# Patient Record
Sex: Female | Born: 1960 | Race: White | Hispanic: No | Smoking: Former smoker
Health system: Southern US, Community
[De-identification: ages and names within clinical notes are randomized; demographics above are authoritative.]

## PROBLEM LIST (undated history)

## (undated) DIAGNOSIS — C801 Malignant (primary) neoplasm, unspecified: Secondary | ICD-10-CM

## (undated) DIAGNOSIS — M1990 Unspecified osteoarthritis, unspecified site: Secondary | ICD-10-CM

## (undated) HISTORY — PX: COLONOSCOPY: SHX174

## (undated) HISTORY — PX: BREAST LUMPECTOMY: SHX2

## (undated) HISTORY — DX: Unspecified osteoarthritis, unspecified site: M19.90

---

## 1982-09-24 HISTORY — PX: TUBAL LIGATION: SHX77

## 1998-01-31 ENCOUNTER — Other Ambulatory Visit: Admission: RE | Admit: 1998-01-31 | Discharge: 1998-01-31 | Payer: Self-pay | Admitting: Gynecology

## 2007-10-03 ENCOUNTER — Encounter: Admission: RE | Admit: 2007-10-03 | Discharge: 2007-10-03 | Payer: Self-pay | Admitting: Family Medicine

## 2008-10-25 ENCOUNTER — Other Ambulatory Visit: Admission: RE | Admit: 2008-10-25 | Discharge: 2008-10-25 | Payer: Self-pay | Admitting: Obstetrics and Gynecology

## 2011-08-08 ENCOUNTER — Ambulatory Visit (HOSPITAL_COMMUNITY)
Admission: RE | Admit: 2011-08-08 | Discharge: 2011-08-08 | Disposition: A | Discharge status: Home / Self Care | Payer: BC Managed Care – PPO | Source: Ambulatory Visit | Attending: Chiropractic Medicine | Admitting: Chiropractic Medicine

## 2011-08-08 ENCOUNTER — Other Ambulatory Visit (HOSPITAL_COMMUNITY): Payer: Self-pay | Admitting: Chiropractic Medicine

## 2011-08-08 DIAGNOSIS — M542 Cervicalgia: Secondary | ICD-10-CM | POA: Insufficient documentation

## 2011-09-25 HISTORY — PX: COLOSTOMY: SHX63

## 2012-01-10 ENCOUNTER — Encounter: Payer: Self-pay | Admitting: Internal Medicine

## 2012-01-10 ENCOUNTER — Other Ambulatory Visit: Payer: Self-pay | Admitting: Specialist

## 2012-01-10 DIAGNOSIS — Z1231 Encounter for screening mammogram for malignant neoplasm of breast: Secondary | ICD-10-CM

## 2012-01-16 ENCOUNTER — Encounter: Payer: Self-pay | Admitting: Internal Medicine

## 2012-01-16 ENCOUNTER — Ambulatory Visit (AMBULATORY_SURGERY_CENTER): Payer: BC Managed Care – PPO | Admitting: *Deleted

## 2012-01-16 VITALS — Ht 67.0 in | Wt 220.9 lb

## 2012-01-16 DIAGNOSIS — Z1211 Encounter for screening for malignant neoplasm of colon: Secondary | ICD-10-CM

## 2012-01-16 MED ORDER — MOVIPREP 100 G PO SOLR: ORAL | Status: DC

## 2012-01-28 ENCOUNTER — Encounter: Payer: Self-pay | Admitting: Internal Medicine

## 2012-01-28 ENCOUNTER — Ambulatory Visit (AMBULATORY_SURGERY_CENTER): Payer: BC Managed Care – PPO | Admitting: Internal Medicine

## 2012-01-28 VITALS — BP 132/81 | HR 69 | Temp 97.5°F | Resp 18 | Ht 67.0 in | Wt 220.0 lb

## 2012-01-28 DIAGNOSIS — Z1211 Encounter for screening for malignant neoplasm of colon: Secondary | ICD-10-CM

## 2012-01-28 DIAGNOSIS — K5730 Diverticulosis of large intestine without perforation or abscess without bleeding: Secondary | ICD-10-CM

## 2012-01-28 MED ORDER — SODIUM CHLORIDE 0.9 % IV SOLN: 500.0000 mL | INTRAVENOUS | Status: DC

## 2012-01-28 NOTE — Patient Instructions (Signed)
A handout was given to your care partner on diverticulosis and a high fiber diet.  You may resume your prior medications today.  Please call if any questions or concerns.    YOU HAD AN ENDOSCOPIC PROCEDURE TODAY AT THE [facility]: Refer to the procedure report that was given to you for any specific questions about what was found during the examination.  If the procedure report does not answer your questions, please call your gastroenterologist to clarify.  If you requested that your care partner not be given the details of your procedure findings, then the procedure report has been included in a sealed envelope for you to review at your convenience later.  YOU SHOULD EXPECT: Some feelings of bloating in the abdomen. Passage of more gas than usual.  Walking can help get rid of the air that was put into your GI tract during the procedure and reduce the bloating. If you had a lower endoscopy (such as a colonoscopy or flexible sigmoidoscopy) you may notice spotting of blood in your stool or on the toilet paper. If you underwent a bowel prep for your procedure, then you may not have a normal bowel movement for a few days.  DIET: Your first meal following the procedure should be a light meal and then it is ok to progress to your normal diet.  A half-sandwich or bowl of soup is an example of a good first meal.  Heavy or fried foods are harder to digest and may make you feel nauseous or bloated.  Likewise meals heavy in dairy and vegetables can cause extra gas to form and this can also increase the bloating.  Drink plenty of fluids but you should avoid alcoholic beverages for 24 hours.  ACTIVITY: Your care partner should take you home directly after the procedure.  You should plan to take it easy, moving slowly for the rest of the day.  You can resume normal activity the day after the procedure however you should NOT DRIVE or use heavy machinery for 24 hours (because of the sedation medicines used  during the test).    SYMPTOMS TO REPORT IMMEDIATELY: A gastroenterologist can be reached at any hour.  During normal business hours, 8:30 AM to 5:00 PM Monday through Friday, call [phone].  After hours and on weekends, please call the GI answering service at [phone] who will take a message and have the physician on call contact you.   Following lower endoscopy (colonoscopy or flexible sigmoidoscopy):  Excessive amounts of blood in the stool  Significant tenderness or worsening of abdominal pains  Swelling of the abdomen that is new, acute  Fever of 100F or higher    FOLLOW UP: If any biopsies were taken you will be contacted by phone or by letter within the next 1-3 weeks.  Call your gastroenterologist if you have not heard about the biopsies in 3 weeks.  Our staff will call the home number listed on your records the next business day following your procedure to check on you and address any questions or concerns that you may have at that time regarding the information given to you following your procedure. This is a courtesy call and so if there is no answer at the home number and we have not heard from you through the emergency physician on call, we will assume that you have returned to your regular daily activities without incident.  SIGNATURES/CONFIDENTIALITY: You and/or your care partner have signed paperwork which will be entered  into your electronic medical record.  These signatures attest to the fact that that the information above on your After Visit Summary has been reviewed and is understood.  Full responsibility of the confidentiality of this discharge information lies with you and/or your care-partner.

## 2012-01-28 NOTE — Op Note (Signed)
[facility] [address] [place], [place]  [address]  COLONOSCOPY PROCEDURE REPORT  PATIENT:  Jaime, Grimes  MR#:  [id number] BIRTHDATE:  10/05/1960, 51 yrs. old  GENDER:  female ENDOSCOPIST:  Iva Boop, MD, [facility] REF. BY:          Arther Abbott, MD PROCEDURE DATE:  01/28/2012 PROCEDURE:  Colonoscopy 19147 ASA CLASS:  Class I INDICATIONS:  Routine Risk Screening MEDICATIONS:   These medications were titrated to patient response per physician's verbal order, Fentanyl 50 mcg IV, Versed 6 mg IV  DESCRIPTION OF PROCEDURE:   After the risks benefits and alternatives of the procedure were thoroughly explained, informed consent was obtained.  Digital rectal exam was performed and revealed no abnormalities.   The LB [id number]-[id number] endoscope was introduced through the anus and advanced to the cecum, which was identified by both the appendix and ileocecal valve, without limitations.  The quality of the prep was excellent, using MoviPrep.  The instrument was then slowly withdrawn as the colon was fully examined. <<PROCEDUREIMAGES>>  FINDINGS:  Moderate diverticulosis was found in the sigmoid colon. This was otherwise a normal examination of the colon. Includes right colon retroflexion.   Retroflexed views in the rectum revealed no abnormalities.    The time to cecum = 2:24 minutes. The scope was then withdrawn in 9:04 minutes from the cecum and the procedure completed. COMPLICATIONS:  None ENDOSCOPIC IMPRESSION: 1) Moderate diverticulosis in the sigmoid colon 2) Otherwise normal examination  REPEAT EXAM:  In 10 year(s) for routine screening colonoscopy.  Iva Boop, MD, [facility]  CC:  The Patient and Arther Abbott, MD  n. Rosalie Doctor:   Iva Boop at 01/28/2012 11:21 AM  Ebony Hail, [phone]

## 2012-01-29 ENCOUNTER — Telehealth: Payer: Self-pay

## 2012-01-29 NOTE — Telephone Encounter (Signed)
  Follow up Call-  Call back number 01/28/2012  Post procedure Call Back phone  # [phone]  Permission to leave phone message Yes     Patient questions:  Do you have a fever, pain , or abdominal swelling? no Pain Score  0 *  Have you tolerated food without any problems? yes  Have you been able to return to your normal activities? yes  Do you have any questions about your discharge instructions: Diet   no Medications  no Follow up visit  no  Do you have questions or concerns about your Care? no  Actions: * If pain score is 4 or above: No action needed, pain <4.

## 2012-02-06 ENCOUNTER — Ambulatory Visit
Admission: RE | Admit: 2012-02-06 | Discharge: 2012-02-06 | Disposition: A | Discharge status: Home / Self Care | Payer: BC Managed Care – PPO | Source: Ambulatory Visit | Attending: Specialist | Admitting: Specialist

## 2012-02-06 DIAGNOSIS — Z1231 Encounter for screening mammogram for malignant neoplasm of breast: Secondary | ICD-10-CM

## 2012-02-08 ENCOUNTER — Other Ambulatory Visit: Payer: Self-pay | Admitting: Specialist

## 2012-02-08 DIAGNOSIS — R928 Other abnormal and inconclusive findings on diagnostic imaging of breast: Secondary | ICD-10-CM

## 2012-02-11 ENCOUNTER — Other Ambulatory Visit: Payer: Self-pay | Admitting: Specialist

## 2012-02-11 DIAGNOSIS — R928 Other abnormal and inconclusive findings on diagnostic imaging of breast: Secondary | ICD-10-CM

## 2014-01-06 DIAGNOSIS — C50911 Malignant neoplasm of unspecified site of right female breast: Secondary | ICD-10-CM | POA: Insufficient documentation

## 2014-02-10 ENCOUNTER — Emergency Department (HOSPITAL_COMMUNITY)
Admission: EM | Admit: 2014-02-10 | Discharge: 2014-02-10 | Disposition: A | Discharge status: Home / Self Care | Payer: BC Managed Care – PPO | Source: Home / Self Care | Attending: Family Medicine | Admitting: Family Medicine

## 2014-02-10 ENCOUNTER — Encounter (HOSPITAL_COMMUNITY): Payer: Self-pay | Admitting: Emergency Medicine

## 2014-02-10 DIAGNOSIS — H15009 Unspecified scleritis, unspecified eye: Secondary | ICD-10-CM

## 2014-02-10 DIAGNOSIS — H15102 Unspecified episcleritis, left eye: Secondary | ICD-10-CM

## 2014-02-10 HISTORY — DX: Malignant (primary) neoplasm, unspecified: C80.1

## 2014-02-10 NOTE — ED Provider Notes (Signed)
Jaime Grimes is a 53 y.o. female who presents to Urgent Care today for left eye irritation present for the last 2-3 days. Patient denies any pain or blurry vision. She notes a small amount of discharge. No fevers or chills nausea vomiting or diarrhea. She feels well otherwise. No foreign body. No contact lenses. No treatments tried.   Past Medical History  Diagnosis Date  . Cancer    History  Substance Use Topics  . Smoking status: Current Every Day Smoker -- 0.50 packs/day    Types: Cigarettes  . Smokeless tobacco: Not on file  . Alcohol Use: No   ROS as above Medications: No current facility-administered medications for this encounter.   Current Outpatient Prescriptions  Medication Sig Dispense Refill  . tamoxifen (NOLVADEX) 20 MG tablet Take 20 mg by mouth daily.      . Multiple Vitamins-Minerals (MULTIVITAMIN WITH MINERALS) tablet Take 1 tablet by mouth daily.        Exam:  BP 123/83  Pulse 72  Temp(Src) 98.4 F (36.9 C) (Oral)  Resp 16  SpO2 97% Gen: Well NAD HEENT: EOMI,  MMM injection of the conjunctiva of the sclera on the medial border sparing the cornea. No significant conjunctiva injection along the eyelid. No further injection on the lateral aspect of the eye. No nodular areas noted. PERRLA bilaterally Lungs: Normal work of breathing. CTABL Heart: RRR no MRG Abd: NABS, Soft. NT, ND Exts: Brisk capillary refill, warm and well perfused.   No results found for this or any previous visit (from the past 24 hour(s)). No results found.  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 Plan to treat with lubricating eyedrops. Followup with ophthalmology if not improved.  Discussed warning signs or symptoms. Please see discharge instructions. Patient expresses understanding.    Gregor Hams, MD 02/10/14 [phone]

## 2014-02-10 NOTE — ED Notes (Signed)
Reports redness and drainage from left eye since 5/18.   Denies fever, n/v/d    No otc meds tried.

## 2014-02-10 NOTE — Discharge Instructions (Signed)
Thank you for coming in today. Use Systane artificial tears.  Followup with Dr. Ellie Lunch if not getting better.    Scleritis and Episcleritis The outer part of the eyeball is covered with a tough fibrous covering called the sclera. It is the white part of the eye. This tough covering also has a thin membrane lying on top of it called the episclera.   When the sclera becomes red and sore (inflamed), it is called scleritis.  When the episclera becomes inflamed, it is called episcleritis. CAUSES   Scleritis is usually more severe and is associated with autoimmune diseases such as:  Rheumatoid arthritis.  Inflammations of the bowel such as Crohn's Disease (regional enteritis).  Ulcerative colitis.  Episcleritis usually has no known cause. SYMPTOMS  Both scleritis and episcleritis cause red patches or a nodule on the eye. DIAGNOSIS  This condition should be examined by an ophthalmologist. This is because very strong medications that have side effects to the body and eye may be required to treat severe attacks. Further investigations into the patient's general health may be necessary. TREATMENT   Episcleritis tends to get better without treatment within a week or two.  Scleritis is more severe. Often, your caregiver will prescribe steroids by mouth (orally) or as drops in the eye. This treatment helps lessen the redness and soreness (inflammation). HOME CARE INSTRUCTIONS   Take all medications as directed.  Keep your follow-up appointments as directed.  Avoid irritation of the involved eye(s).  Stop using hard or soft contact lenses until your caregiver tells you that it is safe to use them. SEEK MEDICAL CARE IF:   Redness or irritation gets worse.  You develop pain or sensitivity to light.  You develop any change in vision in the involved eye(s). Document Released: 09/04/2001 Document Revised: 12/03/2011 Document Reviewed: 01/06/2009 [facility] Patient Information 2014  [place], [place].

## 2014-03-03 DIAGNOSIS — E669 Obesity, unspecified: Secondary | ICD-10-CM | POA: Insufficient documentation

## 2014-03-04 HISTORY — PX: BREAST LUMPECTOMY: SHX2

## 2014-12-21 DIAGNOSIS — Z5181 Encounter for therapeutic drug level monitoring: Secondary | ICD-10-CM | POA: Insufficient documentation

## 2015-08-31 ENCOUNTER — Encounter: Payer: Self-pay | Admitting: Obstetrics and Gynecology

## 2015-08-31 ENCOUNTER — Ambulatory Visit (INDEPENDENT_AMBULATORY_CARE_PROVIDER_SITE_OTHER): Payer: BLUE CROSS/BLUE SHIELD | Admitting: Obstetrics and Gynecology

## 2015-08-31 VITALS — BP 138/88 | HR 80 | Resp 17 | Ht 66.0 in | Wt 218.0 lb

## 2015-08-31 DIAGNOSIS — Z01419 Encounter for gynecological examination (general) (routine) without abnormal findings: Secondary | ICD-10-CM | POA: Diagnosis not present

## 2015-08-31 DIAGNOSIS — Z23 Encounter for immunization: Secondary | ICD-10-CM | POA: Diagnosis not present

## 2015-08-31 DIAGNOSIS — Z853 Personal history of malignant neoplasm of breast: Secondary | ICD-10-CM | POA: Diagnosis not present

## 2015-08-31 DIAGNOSIS — Z124 Encounter for screening for malignant neoplasm of cervix: Secondary | ICD-10-CM | POA: Diagnosis not present

## 2015-08-31 DIAGNOSIS — Z13820 Encounter for screening for osteoporosis: Secondary | ICD-10-CM

## 2015-08-31 NOTE — Progress Notes (Signed)
Patient ID: Jaime Grimes, female   DOB: 1960-10-10, 54 y.o.   MRN: [id number] 54 y.o. G1P1001 Widowed CaucasianF here for annual exam.  She is PMP, no bleeding. No vasomotor symptoms. She has a h/o right breast cancer (2012), s/p lumpectomy, radiation. No chemotherapy. She is on an aromatase inhibitor. She just saw her oncologist and had a mammogram. She is due for a DEXA.  Not currently sexually active. Husband died 4 years ago, CHF.  She has lost 22 lbs since September. Working out a lot, has a Physiological scientist. On a healthy eating plan.     No LMP recorded. Patient is postmenopausal.          Sexually active: No.  The current method of family planning is post menopausal status.    Exercising: Yes.    crossfit, circuit training Smoker:  yes  Health Maintenance: Pap:  09/2013 WNL per patient History of abnormal Pap:  no MMG:  07-15-15 WNL  Colonoscopy:  01-28-12 WNL  BMD:   Never TDaP:  unsure Gardasil: N/A   reports that she has been smoking Cigarettes.  She has been smoking about 0.50 packs per day. She has never used smokeless tobacco. She reports that she drinks alcohol. She reports that she does not use illicit drugs.She is signed up for smoking cessation. She has been smoking x 20 years. Daughter is 59, married, local. Jaime Grimes, 4.  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   Past Medical History  Diagnosis Date  . Cancer [facility])     Breast Cancer    Past Surgical History  Procedure Laterality Date  . Tubal ligation  1984  . Breast lumpectomy    Breast reduction.   Current Outpatient Prescriptions  Medication Sig Dispense Refill  . B Complex Vitamins (VITAMIN-B COMPLEX) TABS Take by mouth.    . Calcium Carbonate-Vitamin D 600-400 MG-UNIT tablet Take by mouth.    . letrozole (FEMARA) 2.5 MG tablet Take by mouth.    . Multiple Vitamins-Minerals (MULTIVITAMIN WITH MINERALS) tablet Take 1 tablet by mouth daily.     No current facility-administered medications for this  visit.    Family History  Problem Relation Age of Onset  . Lung cancer Father   . Breast cancer Sister     Review of Systems  Constitutional: Negative.   HENT: Negative.   Eyes: Negative.   Respiratory: Negative.   Cardiovascular: Negative.   Gastrointestinal: Negative.   Endocrine: Negative.   Genitourinary: Negative.   Musculoskeletal: Negative.   Skin: Negative.   Allergic/Immunologic: Negative.   Neurological: Negative.   Psychiatric/Behavioral: Negative.     Exam:   BP 138/88 mmHg  Pulse 80  Resp 17  Ht 5\' 6"  (1.676 m)  Wt 218 lb (98.884 kg)  BMI 35.20 kg/m2  Weight change: @WEIGHTCHANGE @ Height:   Height: 5\' 6"  (167.6 cm)  Ht Readings from Last 3 Encounters:  08/31/15 5\' 6"  (1.676 m)  01/28/12 5\' 7"  (1.702 m)  01/16/12 5\' 7"  (1.702 m)    General appearance: alert, cooperative and appears stated age Head: Normocephalic, without obvious abnormality, atraumatic Neck: no adenopathy, supple, symmetrical, trachea midline and thyroid normal to inspection and palpation Lungs: clear to auscultation bilaterally Breasts: Evidence of bilateral reduction and right breast lumpectomy with some radiation changes in the lower outer quadrant. No masses. Heart: regular rate and rhythm Abdomen: soft, non-tender; bowel sounds normal; no masses,  no organomegaly Extremities: extremities normal, atraumatic, no cyanosis or edema Skin: Skin color,  texture, turgor normal. No rashes or lesions Lymph nodes: Cervical, supraclavicular, and axillary nodes normal. No abnormal inguinal nodes palpated Neurologic: Grossly normal   Pelvic: External genitalia:  no lesions              Urethra:  normal appearing urethra with no masses, tenderness or lesions              Bartholins and Skenes: normal                 Vagina: normal appearing vagina with normal color and discharge, no lesions              Cervix: no lesions               Bimanual Exam:  Uterus:  normal size, contour, position,  consistency, mobility, non-tender              Adnexa: no mass, fullness, tenderness               Rectovaginal: Confirms               Anus:  normal sphincter tone, no lesions  Chaperone was present for exam.  A:  Well Woman with normal exam  Smoker, planning to quit  H/O breast cancer, on an aromatase inhibitor   P:   Pap with hpv  Mammogram UTD  Colonoscopy UTD  DEXA  TDAP  Continue breast self exams  Continue calcium and vit D  Labs done in 10/16 with her Oncologist (reviewed in [facility])

## 2015-08-31 NOTE — Patient Instructions (Signed)

## 2015-09-03 LAB — IPS PAP TEST WITH HPV

## 2015-10-04 ENCOUNTER — Ambulatory Visit
Admission: RE | Admit: 2015-10-04 | Discharge: 2015-10-04 | Disposition: A | Discharge status: Home / Self Care | Payer: BLUE CROSS/BLUE SHIELD | Source: Ambulatory Visit | Attending: Obstetrics and Gynecology | Admitting: Obstetrics and Gynecology

## 2015-10-04 ENCOUNTER — Telehealth: Payer: Self-pay | Admitting: Obstetrics and Gynecology

## 2015-10-04 DIAGNOSIS — Z13820 Encounter for screening for osteoporosis: Secondary | ICD-10-CM

## 2015-10-04 DIAGNOSIS — Z780 Asymptomatic menopausal state: Secondary | ICD-10-CM

## 2015-10-04 NOTE — Telephone Encounter (Signed)
Mia from [facility] calling regarding patient's appointment today. Patient's diagnosis code is for Osteoporosis and want to make sure that is correct. Number is [id number].

## 2015-10-04 NOTE — Telephone Encounter (Signed)
Spoke with Mia at [facility]. Advised the patient has never had a BMD before and the dx should not be Osteoporosis. Should be post menopausal. New order for BMD placed with correct dx. Mia is agreeable.  Routing to provider for final review. Patient agreeable to disposition. Will close encounter.

## 2016-09-06 ENCOUNTER — Encounter: Payer: Self-pay | Admitting: Obstetrics and Gynecology

## 2016-09-06 ENCOUNTER — Ambulatory Visit (INDEPENDENT_AMBULATORY_CARE_PROVIDER_SITE_OTHER): Payer: BLUE CROSS/BLUE SHIELD | Admitting: Obstetrics and Gynecology

## 2016-09-06 VITALS — BP 122/70 | HR 80 | Resp 16 | Ht 66.0 in | Wt 157.0 lb

## 2016-09-06 DIAGNOSIS — N6321 Unspecified lump in the left breast, upper outer quadrant: Secondary | ICD-10-CM

## 2016-09-06 DIAGNOSIS — R6889 Other general symptoms and signs: Secondary | ICD-10-CM | POA: Diagnosis not present

## 2016-09-06 DIAGNOSIS — Z853 Personal history of malignant neoplasm of breast: Secondary | ICD-10-CM

## 2016-09-06 DIAGNOSIS — R0989 Other specified symptoms and signs involving the circulatory and respiratory systems: Secondary | ICD-10-CM

## 2016-09-06 DIAGNOSIS — Z01419 Encounter for gynecological examination (general) (routine) without abnormal findings: Secondary | ICD-10-CM | POA: Diagnosis not present

## 2016-09-06 NOTE — Patient Instructions (Signed)

## 2016-09-06 NOTE — Progress Notes (Signed)
55 y.o. G1P1001 WidowedCaucasianF here for annual exam.   She has a h/o right breast cancer, first diagnosed in 2013. She is on letrozole. She recently underwent genetic testing and is negative for BRCA1, BRCA2, and PALB2.  She has lost 85 lbs in the last 14 months. Stabilizing around 153-155. She is working out 4 days a week, eating healthy.  Sister died of breast cancer this year. She was 62.  No vaginal bleeding. She has been a widow for 5 years. She has been on a few dates, nothing special yet.  No vasomotor symptoms, she has tolerable vaginal dryness.     No LMP recorded. Patient is postmenopausal.          Sexually active: No.  The current method of family planning is post menopausal status.    Exercising: Yes.    Cross fit  Smoker:  Yes, 1/2 a pack a day. She is considering quitting.   Health Maintenance: Pap:  08-31-15 WNL NEG HR HPV  History of abnormal Pap:  no MMG:  04/2016 [facility]- WNL per patient  Colonoscopy:  01-28-12 WNL repeat in 10 yrs BMD:   10-04-15 Normal  TDaP:  08-31-15 Gardasil: N/A   reports that she has been smoking Cigarettes.  She has been smoking about 0.50 packs per day. She has never used smokeless tobacco. She reports that she drinks alcohol. She reports that she does not use drugs. Daughter is 26, married, local. One grandson, 23. She drinks 4 drinks a year.  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   Past Medical History:  Diagnosis Date  . Cancer ([place])    Breast Cancer, negative BRCA testing    Past Surgical History:  Procedure Laterality Date  . BREAST LUMPECTOMY    . TUBAL LIGATION  1984    Current Outpatient Prescriptions  Medication Sig Dispense Refill  . Calcium Carbonate-Vitamin D 600-400 MG-UNIT tablet Take by mouth.    . letrozole (FEMARA) 2.5 MG tablet Take by mouth.    . Multiple Vitamins-Minerals (MULTIVITAMIN WITH MINERALS) tablet Take 1 tablet by mouth daily.    . Vitamin D, Ergocalciferol, (DRISDOL) 50000 units CAPS capsule Take one  EVERY TWO WEEKS    . aspirin EC 81 MG tablet Take by mouth.     No current facility-administered medications for this visit.     Family History  Problem Relation Age of Onset  . Lung cancer Father   . Breast cancer Sister     Review of Systems  Constitutional: Negative.   HENT: Positive for trouble swallowing.   Eyes: Negative.   Respiratory: Negative.   Cardiovascular: Negative.   Gastrointestinal: Negative.   Endocrine: Negative.   Genitourinary: Negative.   Musculoskeletal: Negative.   Skin: Negative.   Allergic/Immunologic: Negative.   Neurological: Negative.   Psychiatric/Behavioral: Negative.   She c/o a one month h/o feeling her throat is tight intermittently. Not choking.   Exam:   BP 122/70 (BP Location: Left Arm, Patient Position: Sitting, Cuff Size: Normal)   Pulse 80   Resp 16   Ht '5\' 6"'$  (1.676 m)   Wt 157 lb (71.2 kg)   BMI 25.34 kg/m   Weight change: '@WEIGHTCHANGE'$ @ Height:   Height: '5\' 6"'$  (167.6 cm)  Ht Readings from Last 3 Encounters:  09/06/16 '5\' 6"'$  (1.676 m)  08/31/15 '5\' 6"'$  (1.676 m)  01/28/12 '5\' 7"'$  (1.702 m)    General appearance: alert, cooperative and appears stated age Head: Normocephalic, without obvious abnormality, atraumatic Neck:  no adenopathy, supple, symmetrical, trachea midline and thyroid normal to inspection and palpation Lungs: clear to auscultation bilaterally Cardiovascular: regular rate and rhythm Breasts: left breast lump at 2 o'clock 1 cm from the areolar region, just under 1 cm, firm mobile. Left breast with evidence of breast surgery. Right breast with prior lumpectomy.  Heart: regular rate and rhythm Abdomen: soft, non-tender; bowel sounds normal; no masses,  no organomegaly Extremities: extremities normal, atraumatic, no cyanosis or edema Skin: Skin color, texture, turgor normal. No rashes or lesions Lymph nodes: Cervical, supraclavicular, and axillary nodes normal. No abnormal inguinal nodes palpated Neurologic: Grossly  normal   Pelvic: External genitalia:  no lesions              Urethra:  normal appearing urethra with no masses, tenderness or lesions              Bartholins and Skenes: normal                 Vagina: normal appearing atrophic, vagina with normal color and discharge, no lesions              Cervix: no lesions               Bimanual Exam:  Uterus:  normal size, contour, position, consistency, mobility, non-tender              Adnexa: no mass, fullness, tenderness               Rectovaginal: Confirms               Anus:  normal sphincter tone, no lesions  Chaperone was present for exam.  A:  Well Woman with normal exam  H/O right breast cancer, recent negative genetic testing  New left breast lump at 2 o'clock  P:   No pap this year  UTD on mammogram, colonoscopy and DEXA  Labs are done [facility] at [facility]  She is getting calcium and vit D  Continue breast self exams  Diagnostic imaging left breast.

## 2016-09-06 NOTE — Progress Notes (Signed)
Scheduled patient while in office for L breast diagnostic imaging and ultrasound at [facility] [address] [place], [place] [address] on 09/13/2016 at 11 am. Patient is agreeable to date and time. Placed in mammogram hold.

## 2016-10-17 ENCOUNTER — Telehealth: Payer: Self-pay | Admitting: Obstetrics and Gynecology

## 2016-10-17 NOTE — Telephone Encounter (Signed)
Patient canceled her appointment 10/18/16 for 6 week breast check. Patient said she would call later to reschedule.

## 2016-10-18 ENCOUNTER — Ambulatory Visit: Payer: BLUE CROSS/BLUE SHIELD | Admitting: Obstetrics and Gynecology

## 2016-10-30 NOTE — Telephone Encounter (Signed)
Okay to close this encounter? °

## 2016-10-30 NOTE — Telephone Encounter (Signed)
Will close encounter

## 2016-11-26 ENCOUNTER — Telehealth: Payer: Self-pay | Admitting: *Deleted

## 2016-11-26 NOTE — Telephone Encounter (Signed)
Call to [facility] to inquire about 09/13/16 MMG follow-up recommendations. Spoke with Jaime Grimes, was advised patient to return after 04/27/17 for yearly diagnostic MMG.    Dr. Talbert Nan, routing [place].

## 2016-12-06 NOTE — Telephone Encounter (Signed)
Dr. Talbert Nan , ok to close encounter?

## 2016-12-06 NOTE — Telephone Encounter (Signed)
Will close encounter

## 2017-01-01 ENCOUNTER — Encounter: Payer: Self-pay | Admitting: Obstetrics and Gynecology

## 2017-09-12 ENCOUNTER — Ambulatory Visit (INDEPENDENT_AMBULATORY_CARE_PROVIDER_SITE_OTHER): Payer: BLUE CROSS/BLUE SHIELD | Admitting: Obstetrics and Gynecology

## 2017-09-12 ENCOUNTER — Encounter: Payer: Self-pay | Admitting: Obstetrics and Gynecology

## 2017-09-12 ENCOUNTER — Other Ambulatory Visit: Payer: Self-pay

## 2017-09-12 VITALS — BP 128/78 | HR 80 | Resp 18 | Ht 66.0 in | Wt 157.0 lb

## 2017-09-12 DIAGNOSIS — E559 Vitamin D deficiency, unspecified: Secondary | ICD-10-CM

## 2017-09-12 DIAGNOSIS — N393 Stress incontinence (female) (male): Secondary | ICD-10-CM | POA: Diagnosis not present

## 2017-09-12 DIAGNOSIS — Z0000 Encounter for general adult medical examination without abnormal findings: Secondary | ICD-10-CM | POA: Diagnosis not present

## 2017-09-12 DIAGNOSIS — Z01419 Encounter for gynecological examination (general) (routine) without abnormal findings: Secondary | ICD-10-CM | POA: Diagnosis not present

## 2017-09-12 NOTE — Progress Notes (Signed)
56 y.o. G1P1001 WidowedCaucasianF here for annual exam.   H/O right breast cancer. Negative genetic testing. See's her Oncologist in February at [facility], normal mammogram in 8/18. She is on Letrozole.  She isn't currently sexually active. Feels ready to date.  She has some stress incontinence just with exercise. Leaks small amounts, so far tolerable. Has been going on for a long time, she is just exercising more.  Sister died of breast cancer in April 16, 2016.  Husband died 6 years ago, breast cancer in 2011-12-16, sister died last year. She is finally feeling better and happier.     No LMP recorded. Patient is postmenopausal.          Sexually active: No.  The current method of family planning is post menopausal status.    Exercising: Yes.    cross fit  Smoker:  Yes, <1/2 a PPD, hopes to quite next year.   Health Maintenance: Pap:  08-31-15 WNL NEG HR HPV  History of abnormal Pap:  no MMG:  05-24-17 DX MMG WNL  @ [facility]  (Care  Everywhere)  Colonoscopy:  01-28-12 WNL repeat in 10 yrs  BMD:   10-04-15 Normal  TDaP:  08-31-15 Gardasil: N/A   reports that she has been smoking cigarettes.  She has been smoking about 0.25 packs per day. she has never used smokeless tobacco. She reports that she drinks alcohol. She reports that she does not use drugs.  Daughter is 67, married, local. One grandson, 83. She drinks 4 drinks a year.  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   Past Medical History:  Diagnosis Date  . Cancer ([place])    Breast Cancer, negative BRCA testing    Past Surgical History:  Procedure Laterality Date  . BREAST LUMPECTOMY    . TUBAL LIGATION  1984    Current Outpatient Medications  Medication Sig Dispense Refill  . aspirin EC 81 MG tablet Take by mouth.    . Calcium Carbonate-Vitamin D 600-400 MG-UNIT tablet Take by mouth.    . letrozole (FEMARA) 2.5 MG tablet Take by mouth.    . Multiple Vitamins-Minerals (MULTIVITAMIN WITH MINERALS) tablet Take 1 tablet by mouth daily.    .  TURMERIC PO Take by mouth.    . Vitamin D, Ergocalciferol, (DRISDOL) 50000 units CAPS capsule Take one EVERY TWO WEEKS     No current facility-administered medications for this visit.     Family History  Problem Relation Age of Onset  . Lung cancer Father   . Breast cancer Sister     Review of Systems  Constitutional: Negative.   HENT: Negative.   Eyes: Negative.   Respiratory: Negative.   Cardiovascular: Negative.   Gastrointestinal: Negative.   Endocrine: Negative.   Genitourinary:       Urinary leakage with jumping exercises  Musculoskeletal: Negative.   Skin: Negative.   Allergic/Immunologic: Negative.   Neurological: Negative.   Psychiatric/Behavioral: Negative.     Exam:   BP 128/78 (BP Location: Right Arm, Patient Position: Sitting, Cuff Size: Normal)   Pulse 80   Resp 18   Ht '5\' 6"'$  (1.676 m)   Wt 157 lb (71.2 kg)   BMI 25.34 kg/m   Weight change: '@WEIGHTCHANGE'$ @ Height:   Height: '5\' 6"'$  (167.6 cm)  Ht Readings from Last 3 Encounters:  09/12/17 '5\' 6"'$  (1.676 m)  09/06/16 '5\' 6"'$  (1.676 m)  08/31/15 '5\' 6"'$  (1.676 m)    General appearance: alert, cooperative and appears stated age Head: Normocephalic, without  obvious abnormality, atraumatic Neck: no adenopathy, supple, symmetrical, trachea midline and thyroid normal to inspection and palpation Lungs: clear to auscultation bilaterally Cardiovascular: regular rate and rhythm Breasts: evidence of bilateral breast surgery. Stable lump in the left breast at 2 o'clock, 1 cm from areolar region. Under 1 cm, firm and mobile (prior negative imaging). Abdomen: soft, non-tender; non distended,  no masses,  no organomegaly Extremities: extremities normal, atraumatic, no cyanosis or edema Skin: Skin color, texture, turgor normal. No rashes or lesions Lymph nodes: Cervical, supraclavicular, and axillary nodes normal. No abnormal inguinal nodes palpated Neurologic: Grossly normal   Pelvic: External genitalia:  no lesions               Urethra:  normal appearing urethra with no masses, tenderness or lesions              Bartholins and Skenes: normal                 Vagina: normal appearing vagina with normal color and discharge, no lesions              Cervix: no lesions               Bimanual Exam:  Uterus:  normal size, contour, position, consistency, mobility, non-tender              Adnexa: no mass, fullness, tenderness               Rectovaginal: Confirms               Anus:  normal sphincter tone, no lesions  Chaperone was present for exam.  A:  Well Woman with normal exam  Vit d def  P:   Pap next year  Mammogram UTD  Colonoscopy UTD  Normal DEXA last year  Discussed breast self exam  Discussed calcium and vit D intake  Screening labs, vit D

## 2017-09-12 NOTE — Patient Instructions (Signed)
EXERCISE AND DIET:  We recommended that you start or continue a regular exercise program for good health. Regular exercise means any activity that makes your heart beat faster and makes you sweat.  We recommend exercising at least 30 minutes per day at least 3 days a week, preferably 4 or 5.  We also recommend a diet low in fat and sugar.  Inactivity, poor dietary choices and obesity can cause diabetes, heart attack, stroke, and kidney damage, among others.    ALCOHOL AND SMOKING:  Women should limit their alcohol intake to no more than 7 drinks/beers/glasses of wine (combined, not each!) per week. Moderation of alcohol intake to this level decreases your risk of breast cancer and liver damage. And of course, no recreational drugs are part of a healthy lifestyle.  And absolutely no smoking or even second hand smoke. Most people know smoking can cause heart and lung diseases, but did you know it also contributes to weakening of your bones? Aging of your skin?  Yellowing of your teeth and nails?  CALCIUM AND VITAMIN D:  Adequate intake of calcium and Vitamin D are recommended.  The recommendations for exact amounts of these supplements seem to change often, but generally speaking 600 mg of calcium (either carbonate or citrate) and 800 units of Vitamin D per day seems prudent. Certain women may benefit from higher intake of Vitamin D.  If you are among these women, your doctor will have told you during your visit.    PAP SMEARS:  Pap smears, to check for cervical cancer or precancers,  have traditionally been done yearly, although recent scientific advances have shown that most women can have pap smears less often.  However, every woman still should have a physical exam from her gynecologist every year. It will include a breast check, inspection of the vulva and vagina to check for abnormal growths or skin changes, a visual exam of the cervix, and then an exam to evaluate the size and shape of the uterus and  ovaries.  And after 56 years of age, a rectal exam is indicated to check for rectal cancers. We will also provide age appropriate advice regarding health maintenance, like when you should have certain vaccines, screening for sexually transmitted diseases, bone density testing, colonoscopy, mammograms, etc.   MAMMOGRAMS:  All women over 40 years old should have a yearly mammogram. Many facilities now offer a "3D" mammogram, which may cost around $50 extra out of pocket. If possible,  we recommend you accept the option to have the 3D mammogram performed.  It both reduces the number of women who will be called back for extra views which then turn out to be normal, and it is better than the routine mammogram at detecting truly abnormal areas.    COLONOSCOPY:  Colonoscopy to screen for colon cancer is recommended for all women at age 50.  We know, you hate the idea of the prep.  We agree, BUT, having colon cancer and not knowing it is worse!!  Colon cancer so often starts as a polyp that can be seen and removed at colonscopy, which can quite literally save your life!  And if your first colonoscopy is normal and you have no family history of colon cancer, most women don't have to have it again for 10 years.  Once every ten years, you can do something that may end up saving your life, right?  We will be happy to help you get it scheduled when you are ready.    Be sure to check your insurance coverage so you understand how much it will cost.  It may be covered as a preventative service at no cost, but you should check your particular policy.      Kegel Exercises Kegel exercises help strengthen the muscles that support the rectum, vagina, small intestine, bladder, and uterus. Doing Kegel exercises can help:  Improve bladder and bowel control.  Improve sexual response.  Reduce problems and discomfort during pregnancy.  Kegel exercises involve squeezing your pelvic floor muscles, which are the same muscles you  squeeze when you try to stop the flow of urine. The exercises can be done while sitting, standing, or lying down, but it is best to vary your position. Phase 1 exercises 1. Squeeze your pelvic floor muscles tight. You should feel a tight lift in your rectal area. If you are a female, you should also feel a tightness in your vaginal area. Keep your stomach, buttocks, and legs relaxed. 2. Hold the muscles tight for up to 10 seconds. 3. Relax your muscles. Repeat this exercise 50 times a day or as many times as told by your health care provider. Continue to do this exercise for at least 4-6 weeks or for as long as told by your health care provider. This information is not intended to replace advice given to you by your health care provider. Make sure you discuss any questions you have with your health care provider. Document Released: 08/27/2012 Document Revised: 05/05/2016 Document Reviewed: 07/31/2015 Elsevier Interactive Patient Education  2018 Elsevier Inc.  

## 2017-09-13 LAB — COMPREHENSIVE METABOLIC PANEL
ALBUMIN: 4.6 g/dL (ref 3.5–5.5)
ALT: 117 IU/L — ABNORMAL HIGH (ref 0–32)
AST: 221 IU/L — ABNORMAL HIGH (ref 0–40)
Albumin/Globulin Ratio: 2.6 — ABNORMAL HIGH (ref 1.2–2.2)
Alkaline Phosphatase: 83 IU/L (ref 39–117)
BILIRUBIN TOTAL: 0.6 mg/dL (ref 0.0–1.2)
BUN / CREAT RATIO: 15 (ref 9–23)
BUN: 14 mg/dL (ref 6–24)
CALCIUM: 9.7 mg/dL (ref 8.7–10.2)
CHLORIDE: 101 mmol/L (ref 96–106)
CO2: 26 mmol/L (ref 20–29)
Creatinine, Ser: 0.95 mg/dL (ref 0.57–1.00)
GFR, EST AFRICAN AMERICAN: 77 mL/min/{1.73_m2} (ref >59)
GFR, EST NON AFRICAN AMERICAN: 67 mL/min/{1.73_m2} (ref >59)
GLUCOSE: 89 mg/dL (ref 65–99)
Globulin, Total: 1.8 g/dL (ref 1.5–4.5)
Potassium: 4.7 mmol/L (ref 3.5–5.2)
Sodium: 140 mmol/L (ref 134–144)
TOTAL PROTEIN: 6.4 g/dL (ref 6.0–8.5)

## 2017-09-13 LAB — CBC
HEMOGLOBIN: 14.7 g/dL (ref 11.1–15.9)
Hematocrit: 44.4 % (ref 34.0–46.6)
MCH: 31.1 pg (ref 26.6–33.0)
MCHC: 33.1 g/dL (ref 31.5–35.7)
MCV: 94 fL (ref 79–97)
Platelets: 284 10*3/uL (ref 150–379)
RBC: 4.72 x10E6/uL (ref 3.77–5.28)
RDW: 13.6 % (ref 12.3–15.4)
WBC: 13.7 10*3/uL — ABNORMAL HIGH (ref 3.4–10.8)

## 2017-09-13 LAB — LIPID PANEL
CHOL/HDL RATIO: 1.8 ratio (ref 0.0–4.4)
Cholesterol, Total: 192 mg/dL (ref 100–199)
HDL: 105 mg/dL (ref >39)
LDL CALC: 71 mg/dL (ref 0–99)
Triglycerides: 81 mg/dL (ref 0–149)
VLDL CHOLESTEROL CAL: 16 mg/dL (ref 5–40)

## 2017-09-13 LAB — VITAMIN D 25 HYDROXY (VIT D DEFICIENCY, FRACTURES): VIT D 25 HYDROXY: 26.9 ng/mL — AB (ref 30.0–100.0)

## 2017-09-20 ENCOUNTER — Telehealth: Payer: Self-pay

## 2017-09-20 NOTE — Telephone Encounter (Signed)
Spoke with patient and notified of elevated LFTs and WBC. Advised she will need to see PCP for follow up, Serita Grammes, CNM. We will send copy of labs to PCP and to Oncologist. Patient states she will call to make appointment with PCP. Also advised of low Vitamin D level and she states she is having a hard time remembering to take this. She knows she should take 50,000 IU every other week but states she is not doing this. She does good to take once a month. Routed to provider

## 2017-09-20 NOTE — Telephone Encounter (Signed)
Called patient to discuss lab results, left message on voicemail to return my call.

## 2017-09-20 NOTE — Telephone Encounter (Signed)
-----   Message from Salvadore Dom, MD sent at 09/19/2017  5:26 PM EST ----- Please inform the patient that her LFT's are elevated, her WBC is slightly elevated and her vit D is low.  Please check with her who her primary is (a CNM is listed in Epic). She will need to see her primary to evaluate her for her elevated LFT's. I would send a copy to her primary and to her Oncologist. Her vit d level is slightly low. I think she told me she hasn't been consistent in taking her vit d. Please see how much and how often she is taking her vit d so we can figure out how much to increase it.

## 2017-09-24 NOTE — Telephone Encounter (Signed)
Sorry for the miscommunication. She needs to see an internist or family practice doctor, not a midwife for evaluation of her abnormal labs. A midwife is listed as her primary, please check with her, if she doesn't have an internist or family practice MD, we need to refer her.

## 2017-09-25 NOTE — Telephone Encounter (Addendum)
Spoke with patient. Patient states she has not seen Derrill Memo, CNM for PCP in some time. Has followed-up with Oncologist at [facility], is scheduled for repeat labs and CT on 11/19/17. Patient states she is in the process of finding PCP, declines assistance. Patient aware to return call to office with any additional questions/concerns/assitance.  Patient would like to make Dr. Talbert Nan aware that she did have a high intensity work out prior to labs being drawn. Advised patient to keep appointment for f/u labs. Will update Dr. Talbert Nan and return call with any additional recommendations.   Routing to provider for final review. Patient is agreeable to disposition. Will close encounter.

## 2017-09-25 NOTE — Telephone Encounter (Signed)
Spoke with patient. Advised as seen below per Dr. Talbert Nan. Patient states she has reviewed lab results dated 09/12/17 with Oncologist, Dr. Juanita Craver. Patient verbalizes understanding and is agreeable.   Lab results dated 09/12/17 faxed via Epic to Dr. Juanita Craver -Oncologist.   Routing to provider for final review. Patient is agreeable to disposition. Will close encounter.

## 2017-09-25 NOTE — Telephone Encounter (Signed)
Please make sure her Oncologist has a copy of her lab work. A high intensity work out shouldn't elevate her LFT's.

## 2018-06-18 ENCOUNTER — Telehealth: Payer: Self-pay | Admitting: Obstetrics and Gynecology

## 2018-06-18 NOTE — Telephone Encounter (Signed)
Left message for patient to reschedule appointment.

## 2018-10-06 ENCOUNTER — Encounter

## 2018-10-06 ENCOUNTER — Ambulatory Visit: Payer: BLUE CROSS/BLUE SHIELD | Admitting: Obstetrics and Gynecology

## 2019-10-12 DIAGNOSIS — M25562 Pain in left knee: Secondary | ICD-10-CM | POA: Diagnosis not present

## 2020-06-24 ENCOUNTER — Ambulatory Visit (HOSPITAL_COMMUNITY)
Admission: EM | Admit: 2020-06-24 | Discharge: 2020-06-24 | Disposition: A | Discharge status: Home / Self Care | Payer: Managed Care, Other (non HMO) | Attending: Family Medicine | Admitting: Family Medicine

## 2020-06-24 ENCOUNTER — Encounter (HOSPITAL_COMMUNITY): Payer: Self-pay | Admitting: *Deleted

## 2020-06-24 DIAGNOSIS — R41840 Attention and concentration deficit: Secondary | ICD-10-CM

## 2020-06-24 DIAGNOSIS — R410 Disorientation, unspecified: Secondary | ICD-10-CM

## 2020-06-24 LAB — CBG MONITORING, ED: Glucose-Capillary: 110 mg/dL — ABNORMAL HIGH (ref 70–99)

## 2020-06-24 NOTE — Discharge Instructions (Signed)
Please seek prompt medical care if: You have: You develop a bad headache that is not helped by medicine. Trouble walking or weakness in your arms and legs. Clear or bloody fluid coming from your nose or ears. Changes in your seeing (vision). You throw up (vomit). You feel confused again. You lose balance. Your speech is slurred. You are sleepier and have trouble staying awake.  These symptoms may be an emergency. Do not wait to see if the symptoms will go away. Get medical help right away. Call your local emergency services. Do not drive yourself to the hospital.

## 2020-06-24 NOTE — ED Triage Notes (Signed)
Patient in with confusion/slight delay and difficulty concentrating that started today while at work. Patient denies any pain, blurry vision, numbness or tingling anywhere or difficulty speaking.  Patient alert and oriented to day and year. Difficulty remembering the month.

## 2020-06-24 NOTE — ED Notes (Signed)
Dr. Mannie Stabile notified of of patient's presenting complaints. Patient moved to rm 1.

## 2020-06-29 NOTE — ED Provider Notes (Signed)
Jaime Grimes   [id number] 06/24/20 Arrival Time: 1219  ASSESSMENT & PLAN:  1. Transient confusion     Declines ED evaluation. Discussed possibility of TIA. She voices understanding. Normal neurological exam here.Marland Kitchen    [place].   Specialty: Emergency Medicine Why: If symptoms worsen in any way. Contact information: [address] [id number] [place] [phone]              Reviewed expectations re: course of current medical issues. Questions answered. Outlined signs and symptoms indicating need for more acute intervention. Patient verbalized understanding. After Visit Summary given.   SUBJECTIVE:  Jaime Grimes is a 59 y.o. female who reports episode of transient confusion today; 1-2 hours; reports difficulty concentrating at work. Otherwise well. No aphasia, ataxia, extremity weakness, visual changes. No extremity sensation changes. Symptoms have resolved. No h/o similar. No head injury.  Social History   Substance and Sexual Activity  Alcohol Use Yes  . Alcohol/week: 0.0 standard drinks   Comment: occ   Social History   Tobacco Use  Smoking Status Current Every Day Smoker  . Packs/day: 0.25  . Types: Cigarettes  Smokeless Tobacco Never Used   Denies illegal drug use.     OBJECTIVE:  Vitals:   06/24/20 1223  BP: (!) 152/91  Pulse: 85  Resp: 18  Temp: 98.4 F (36.9 C)  TempSrc: Oral  SpO2: 98%    General appearance: alert; no distress Eyes: PERRLA; EOMI; conjunctiva normal HENT: normocephalic; atraumatic; TMs normal; nasal mucosa normal; oral mucosa normal Neck: supple with FROM Lungs: clear to auscultation bilaterally Heart: regular rate and rhythm Abdomen: soft, non-tender; bowel sounds normal Extremities: no cyanosis or edema; symmetrical with no gross deformities Skin: warm and dry Neurologic: normal gait; DTR's normal and symmetric; CN 2-12 grossly  intact; extremities with normal strength and sensation Psychological: alert and cooperative; normal mood and affect  Investigations: Results for orders placed or performed during the hospital encounter of 06/24/20  POC CBG monitoring  Result Value Ref Range   Glucose-Capillary 110 (H) 70 - 99 mg/dL   Labs Reviewed  CBG MONITORING, ED - Abnormal; Notable for the following components:      Result Value   Glucose-Capillary 110 (*)    All other components within normal limits     No Known Allergies  Past Medical History:  Diagnosis Date  . Cancer ([place])    Breast Cancer, negative BRCA testing   Social History   Socioeconomic History  . Marital status: Widowed    Spouse name: Not on file  . Number of children: Not on file  . Years of education: Not on file  . Highest education level: Not on file  Occupational History  . Not on file  Tobacco Use  . Smoking status: Current Every Day Smoker    Packs/day: 0.25    Types: Cigarettes  . Smokeless tobacco: Never Used  Vaping Use  . Vaping Use: Never used  Substance and Sexual Activity  . Alcohol use: Yes    Alcohol/week: 0.0 standard drinks    Comment: occ  . Drug use: No  . Sexual activity: Never    Partners: Male  Other Topics Concern  . Not on file  Social History Narrative  . Not on file   Social Determinants of Health   Financial Resource Strain:   . Difficulty of Paying Living Expenses: Not on file  Food Insecurity:   . Worried About Running  Out of Food in the Last Year: Not on file  . Ran Out of Food in the Last Year: Not on file  Transportation Needs:   . Lack of Transportation (Medical): Not on file  . Lack of Transportation (Non-Medical): Not on file  Physical Activity:   . Days of Exercise per Week: Not on file  . Minutes of Exercise per Session: Not on file  Stress:   . Feeling of Stress : Not on file  Social Connections:   . Frequency of Communication with Friends and Family: Not on file  .  Frequency of Social Gatherings with Friends and Family: Not on file  . Attends Religious Services: Not on file  . Active Member of Clubs or Organizations: Not on file  . Attends Archivist Meetings: Not on file  . Marital Status: Not on file  Intimate Partner Violence:   . Fear of Current or Ex-Partner: Not on file  . Emotionally Abused: Not on file  . Physically Abused: Not on file  . Sexually Abused: Not on file   Family History  Problem Relation Age of Onset  . Lung cancer Father   . Breast cancer Sister    Past Surgical History:  Procedure Laterality Date  . BREAST LUMPECTOMY    . TUBAL LIGATION  1984      Vanessa Kick, MD 06/29/20 1006

## 2020-07-05 ENCOUNTER — Other Ambulatory Visit: Payer: Self-pay | Admitting: Internal Medicine

## 2020-07-05 DIAGNOSIS — F17210 Nicotine dependence, cigarettes, uncomplicated: Secondary | ICD-10-CM

## 2020-07-12 DIAGNOSIS — Z923 Personal history of irradiation: Secondary | ICD-10-CM | POA: Insufficient documentation

## 2020-07-13 ENCOUNTER — Other Ambulatory Visit: Payer: Self-pay | Admitting: Internal Medicine

## 2020-07-18 ENCOUNTER — Inpatient Hospital Stay: Admission: RE | Admit: 2020-07-18 | Payer: Managed Care, Other (non HMO) | Source: Ambulatory Visit

## 2021-07-31 ENCOUNTER — Other Ambulatory Visit: Payer: Self-pay | Admitting: Internal Medicine

## 2021-07-31 DIAGNOSIS — Z1231 Encounter for screening mammogram for malignant neoplasm of breast: Secondary | ICD-10-CM

## 2021-09-13 ENCOUNTER — Ambulatory Visit: Payer: Managed Care, Other (non HMO)

## 2021-10-11 ENCOUNTER — Ambulatory Visit
Admission: RE | Admit: 2021-10-11 | Discharge: 2021-10-11 | Disposition: A | Discharge status: Home / Self Care | Payer: BC Managed Care – PPO | Source: Ambulatory Visit | Attending: Internal Medicine | Admitting: Internal Medicine

## 2021-10-11 DIAGNOSIS — Z1231 Encounter for screening mammogram for malignant neoplasm of breast: Secondary | ICD-10-CM

## 2021-12-19 DIAGNOSIS — G609 Hereditary and idiopathic neuropathy, unspecified: Secondary | ICD-10-CM | POA: Insufficient documentation

## 2021-12-19 DIAGNOSIS — E559 Vitamin D deficiency, unspecified: Secondary | ICD-10-CM | POA: Insufficient documentation

## 2022-03-31 ENCOUNTER — Encounter: Payer: Self-pay | Admitting: Internal Medicine

## 2022-04-13 ENCOUNTER — Encounter: Payer: Self-pay | Admitting: Internal Medicine

## 2022-04-18 ENCOUNTER — Ambulatory Visit (AMBULATORY_SURGERY_CENTER): Payer: Self-pay

## 2022-04-18 ENCOUNTER — Encounter: Payer: Self-pay | Admitting: Internal Medicine

## 2022-04-18 VITALS — Ht 66.0 in | Wt 171.0 lb

## 2022-04-18 DIAGNOSIS — Z1211 Encounter for screening for malignant neoplasm of colon: Secondary | ICD-10-CM

## 2022-04-18 NOTE — Progress Notes (Signed)
No egg or soy allergy known to patient  No issues known to pt with past sedation with any surgeries or procedures Patient denies ever being told they had issues or difficulty with intubation  No FH of Malignant Hyperthermia Pt is not on diet pills Pt is not on  home 02  Pt is not on blood thinners  Pt denies issues with constipation  No A fib or A flutter Have any cardiac testing pending--denied Pt instructed to use [email or web address] or GoodRx for a price reduction on prep  Denied

## 2022-05-17 NOTE — Progress Notes (Signed)
[facility] History and Physical   Primary Care Physician:  Myrtis Ser, CNM   Reason for Procedure:   CRCA screening  Plan:    colonoscopy     HPI: Jaime Grimes is a 61 y.o. female for screening exam   Past Medical History:  Diagnosis Date   Arthritis    Cancer ([place])    Breast Cancer, negative BRCA testing    Past Surgical History:  Procedure Laterality Date   BREAST LUMPECTOMY     COLOSTOMY  2013   TUBAL LIGATION  09/24/1982    Prior to Admission medications   Medication Sig Start Date End Date Taking? Authorizing Provider  B Complex-Biotin-FA (B COMPLETE PO) B Complete  Take 1 capsule daily    [provider]  Multiple Vitamins-Minerals (MULTIVITAMIN WITH MINERALS) tablet Take 1 tablet by mouth daily.    [provider]  SAMBUCOL BLACK ELDERBERRY PO  09/24/20   [provider]  TURMERIC PO Take by mouth.    [provider]  VITAMIN D PO Take by mouth.    [provider]    Current Outpatient Medications  Medication Sig Dispense Refill   B Complex-Biotin-FA (B COMPLETE PO) B Complete  Take 1 capsule daily     Multiple Vitamins-Minerals (MULTIVITAMIN WITH MINERALS) tablet Take 1 tablet by mouth daily.     SAMBUCOL BLACK ELDERBERRY PO      TURMERIC PO Take by mouth.     VITAMIN D PO Take by mouth.     No current facility-administered medications for this visit.    Allergies as of 05/18/2022   (No Known Allergies)    Family History  Problem Relation Age of Onset   Lung cancer Father    Breast cancer Sister    Colon cancer Neg Hx    Colon polyps Neg Hx    Rectal cancer Neg Hx    Stomach cancer Neg Hx    Esophageal cancer Neg Hx     Social History   Socioeconomic History   Marital status: Widowed    Spouse name: Not on file   Number of children: Not on file   Years of education: Not on file   Highest education level: Not on file  Occupational History   Not on file  Tobacco Use    Smoking status: Every Day    Packs/day: 0.25    Types: Cigarettes   Smokeless tobacco: Never  Vaping Use   Vaping Use: Never used  Substance and Sexual Activity   Alcohol use: Not Currently    Comment: occ   Drug use: No   Sexual activity: Never    Partners: Male  Other Topics Concern   Not on file  Social History Narrative   Not on file   Social Determinants of Health   Financial Resource Strain: Not on file  Food Insecurity: Not on file  Transportation Needs: Not on file  Physical Activity: Not on file  Stress: Not on file  Social Connections: Not on file  Intimate Partner Violence: Not on file    Review of Systems:  All other review of systems negative except as mentioned in the HPI.  Physical Exam: Vital signs There were no vitals taken for this visit.  General:   Alert,  Well-developed, well-nourished, pleasant and cooperative in NAD Lungs:  Clear throughout to auscultation.   Heart:  Regular rate and rhythm; no murmurs, clicks, rubs,  or gallops. Abdomen:  Soft, nontender and nondistended. Normal  bowel sounds.   Neuro/Psych:  Alert and cooperative. Normal mood and affect. A and O x 3   Gatha Mayer, MD, [facility]

## 2022-05-18 ENCOUNTER — Ambulatory Visit (AMBULATORY_SURGERY_CENTER): Payer: BC Managed Care – PPO | Admitting: Internal Medicine

## 2022-05-18 ENCOUNTER — Encounter: Payer: Self-pay | Admitting: Internal Medicine

## 2022-05-18 VITALS — BP 135/66 | HR 52 | Temp 98.7°F | Resp 16 | Ht 66.0 in | Wt 171.0 lb

## 2022-05-18 DIAGNOSIS — Z1211 Encounter for screening for malignant neoplasm of colon: Secondary | ICD-10-CM | POA: Diagnosis present

## 2022-05-18 MED ORDER — SODIUM CHLORIDE 0.9 % IV SOLN: 500.0000 mL | Freq: Once | INTRAVENOUS | Status: DC

## 2022-05-18 NOTE — Progress Notes (Signed)
Pt's states no medical or surgical changes since previsit or office visit. 

## 2022-05-18 NOTE — Op Note (Signed)
[facility] Patient Name: Jaime Grimes Procedure Date: 05/18/2022 3:34 PM MRN: [id number] Endoscopist: Gatha Mayer , MD Age: 61 Referring MD:  Date of Birth: 1961/04/13 Gender: Female Account #: [id number] Procedure:                Colonoscopy Indications:              Screening for colorectal malignant neoplasm Medicines:                Monitored Anesthesia Care Procedure:                Pre-Anesthesia Assessment:                           - Prior to the procedure, a History and Physical                            was performed, and patient medications and                            allergies were reviewed. The patient's tolerance of                            previous anesthesia was also reviewed. The risks                            and benefits of the procedure and the sedation                            options and risks were discussed with the patient.                            All questions were answered, and informed consent                            was obtained. Prior Anticoagulants: The patient has                            taken no previous anticoagulant or antiplatelet                            agents. ASA Grade Assessment: II - A patient with                            mild systemic disease. After reviewing the risks                            and benefits, the patient was deemed in                            satisfactory condition to undergo the procedure.                           After obtaining informed consent, the colonoscope  was passed under direct vision. Throughout the                            procedure, the patient's blood pressure, pulse, and                            oxygen saturations were monitored continuously. The                            Olympus CF-HQ190L [id number]) Colonoscope was                            introduced through the anus and advanced to the the                            cecum, identified by  appendiceal orifice and                            ileocecal valve. The colonoscopy was performed                            without difficulty. The patient tolerated the                            procedure well. The quality of the bowel                            preparation was good. The ileocecal valve,                            appendiceal orifice, and rectum were photographed.                            The bowel preparation used was Miralax via split                            dose instruction. Scope In: 2:72:53 PM Scope Out: 4:00:59 PM Scope Withdrawal Time: 0 hours 11 minutes 42 seconds  Total Procedure Duration: 0 hours 14 minutes 41 seconds  Findings:                 The perianal and digital rectal examinations were                            normal.                           Multiple diverticula were found in the sigmoid                            colon and descending colon.                           The exam was otherwise without abnormality on  direct and retroflexion views. Complications:            No immediate complications. Estimated Blood Loss:     Estimated blood loss: none. Impression:               - Diverticulosis in the sigmoid colon and in the                            descending colon.                           - The examination was otherwise normal on direct                            and retroflexion views.                           - No specimens collected. Recommendation:           - Patient has a contact number available for                            emergencies. The signs and symptoms of potential                            delayed complications were discussed with the                            patient. Return to normal activities tomorrow.                            Written discharge instructions were provided to the                            patient.                           - Resume previous diet.                            - Continue present medications.                           - Repeat colonoscopy in 10 years for screening                            purposes. Gatha Mayer, MD 05/18/2022 4:06:05 PM This report has been signed electronically.

## 2022-05-18 NOTE — Patient Instructions (Addendum)
No polyps or cancer seen.  You do have diverticulosis - thickened muscle rings and pouches in the colon wall. Please read the handout about this condition.  Next routine colonoscopy or other screening test in 10 years - 2033.  I appreciate the opportunity to care for you. Gatha Mayer, MD, [facility]  Handout on diverticulosis given.   YOU HAD AN ENDOSCOPIC PROCEDURE TODAY AT [facility]:   Refer to the procedure report that was given to you for any specific questions about what was found during the examination.  If the procedure report does not answer your questions, please call your gastroenterologist to clarify.  If you requested that your care partner not be given the details of your procedure findings, then the procedure report has been included in a sealed envelope for you to review at your convenience later.  YOU SHOULD EXPECT: Some feelings of bloating in the abdomen. Passage of more gas than usual.  Walking can help get rid of the air that was put into your GI tract during the procedure and reduce the bloating. If you had a lower endoscopy (such as a colonoscopy or flexible sigmoidoscopy) you may notice spotting of blood in your stool or on the toilet paper. If you underwent a bowel prep for your procedure, you may not have a normal bowel movement for a few days.  Please Note:  You might notice some irritation and congestion in your nose or some drainage.  This is from the oxygen used during your procedure.  There is no need for concern and it should clear up in a day or so.  SYMPTOMS TO REPORT IMMEDIATELY:  Following lower endoscopy (colonoscopy or flexible sigmoidoscopy):  Excessive amounts of blood in the stool  Significant tenderness or worsening of abdominal pains  Swelling of the abdomen that is new, acute  Fever of 100F or higher   For urgent or emergent issues, a gastroenterologist can be reached at any hour by calling [phone]. Do not use MyChart  messaging for urgent concerns.    DIET:  We do recommend a small meal at first, but then you may proceed to your regular diet.  Drink plenty of fluids but you should avoid alcoholic beverages for 24 hours.  ACTIVITY:  You should plan to take it easy for the rest of today and you should NOT DRIVE or use heavy machinery until tomorrow (because of the sedation medicines used during the test).    FOLLOW UP: Our staff will call the number listed on your records the next business day following your procedure.  We will call around 7:15- 8:00 am to check on you and address any questions or concerns that you may have regarding the information given to you following your procedure. If we do not reach you, we will leave a message.  If you develop any symptoms (ie: fever, flu-like symptoms, shortness of breath, cough etc.) before then, please call [phone].  If you test positive for Covid 19 in the 2 weeks post procedure, please call and report this information to [place].    If any biopsies were taken you will be contacted by phone or by letter within the next 1-3 weeks.  Please call us at [phone] if you have not heard about the biopsies in 3 weeks.    SIGNATURES/CONFIDENTIALITY: You and/or your care partner have signed paperwork which will be entered into your electronic medical record.  These signatures attest to the fact that that the information  above on your After Visit Summary has been reviewed and is understood.  Full responsibility of the confidentiality of this discharge information lies with you and/or your care-partner.  

## 2022-05-18 NOTE — Progress Notes (Signed)
To pacu, VSS. Report to [email or web address] 

## 2022-05-21 ENCOUNTER — Telehealth: Payer: Self-pay

## 2022-05-21 NOTE — Telephone Encounter (Signed)
  Follow up Call-     05/18/2022    2:51 PM  Call back number  Post procedure Call Back phone  # [phone]  Permission to leave phone message Yes     Patient questions:  Do you have a fever, pain , or abdominal swelling? No. Pain Score  0 *  Have you tolerated food without any problems? Yes.    Have you been able to return to your normal activities? Yes.    Do you have any questions about your discharge instructions: Diet   No. Medications  No. Follow up visit  No.  Do you have questions or concerns about your Care? No.  Actions: * If pain score is 4 or above: No action needed, pain <4.

## 2022-07-26 ENCOUNTER — Ambulatory Visit (INDEPENDENT_AMBULATORY_CARE_PROVIDER_SITE_OTHER): Payer: BC Managed Care – PPO | Admitting: Podiatry

## 2022-07-26 DIAGNOSIS — G5781 Other specified mononeuropathies of right lower limb: Secondary | ICD-10-CM

## 2022-07-26 NOTE — Progress Notes (Signed)
  Subjective:  Patient ID: Jaime Grimes, female    DOB: 1961-02-15,  MRN: [id number]  Chief Complaint  Patient presents with   Numbness    RIGHT 4TH & 5TH TOES ARE NUMB ALL THE TIME. Patient has not been prescribed any medication for neuropathy.     61 y.o. female presents with concern for numbness in the right fourth and fifth toes.  She states she has chronic numbness in the right fourth and fifth toes.  This has been present for months at least.  She does recall an incident that may have sparked it when she was rolling her right quadriceps muscle on a foam roller.  She had a sharp pain in that area and since then has had some strange numbness in the right lower leg and right foot.  The numbness seems to be well localized now to the right lateral forefoot.  Denies numbness anywhere else on the foot ankle or leg.  Denies any other known acute injury.  Denies low back pain.  She is also seen a chiropractor for this issue.  Past Medical History:  Diagnosis Date   Arthritis    Cancer ([place])    Breast Cancer, negative BRCA testing    No Known Allergies  ROS: Negative except as per HPI above  Objective:  General: AAO x3, NAD  Dermatological: With inspection and palpation of the right and left lower extremities there are no open sores, no preulcerative lesions, no rash or signs of infection present. Nails are of normal length thickness and coloration.   Vascular:  Dorsalis Pedis artery and Posterior Tibial artery pedal pulses are 2/4 bilateral.  Capillary fill time < 3 sec to all digits.   Neruologic: Grossly intact via light touch except with complete numbness of the distal lateral forefoot and fourth and fifth toes of the right foot.  Somewhat following a sural nerve distribution.  Musculoskeletal: No gross boney pedal deformities bilateral. No pain, crepitus, or limitation noted with foot and ankle range of motion bilateral. Muscular strength 5/5 in all groups tested bilateral.  Gait:  Unassisted, Nonantalgic.   No images are attached to the encounter.   Assessment:   1. Sural neuropathy, right      Plan:  Patient was evaluated and treated and all questions answered.  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  Unclear what could have caused this other than this foam rolling incident as the most likely reason for her numbness. -I discussed that unfortunately we cannot provide any medication or do anything that would reverse the numbness from a medication or surgical approach. -As she does not have any pain with regards to this numbness I do not recommend any medication management  -I offered the patient a referral to neurology to further investigate this issue if she desires and she did want to proceed with that so I will place a referral to neurology for further work-up of her isolated right lateral forefoot numbness.  Return if symptoms worsen or fail to improve.          Everitt Amber, DPM [facility]

## 2022-08-25 DIAGNOSIS — F17200 Nicotine dependence, unspecified, uncomplicated: Secondary | ICD-10-CM | POA: Insufficient documentation

## 2022-08-27 ENCOUNTER — Other Ambulatory Visit: Payer: Self-pay | Admitting: Internal Medicine

## 2022-08-27 DIAGNOSIS — F17200 Nicotine dependence, unspecified, uncomplicated: Secondary | ICD-10-CM

## 2022-09-13 ENCOUNTER — Ambulatory Visit (INDEPENDENT_AMBULATORY_CARE_PROVIDER_SITE_OTHER): Payer: BC Managed Care – PPO | Admitting: Neurology

## 2022-09-13 ENCOUNTER — Encounter: Payer: Self-pay | Admitting: Neurology

## 2022-09-13 VITALS — BP 130/82 | HR 62 | Ht 66.0 in | Wt 172.0 lb

## 2022-09-13 DIAGNOSIS — R202 Paresthesia of skin: Secondary | ICD-10-CM | POA: Insufficient documentation

## 2022-09-13 DIAGNOSIS — M2041 Other hammer toe(s) (acquired), right foot: Secondary | ICD-10-CM

## 2022-09-13 DIAGNOSIS — M79604 Pain in right leg: Secondary | ICD-10-CM

## 2022-09-13 NOTE — Progress Notes (Addendum)
Chief Complaint  Patient presents with   New Patient (Initial Visit)    Rm 15. Alone. NP Internal referral for sural neuropathy.      ASSESSMENT AND PLAN  Jaime Grimes is a 61 y.o. female   Right posterior leg pain, radiating pain to right foot, paresthesia of right toes, right hammertoe  Symptoms gradually started 5 years ago, getting worse over the past 3 years, has tried acupuncture, physical therapy, over-the-counter medications,  Most worrisome for right lumbar radiculopathy,  MRI of lumbar spine  EMG nerve conduction study  DIAGNOSTIC DATA (LABS, IMAGING, TESTING) - I reviewed patient records, labs, notes, testing and imaging myself where available.   MEDICAL HISTORY:  Jaime Grimes, is a 61 year old female seen in request by podiatrist Dr. Loel Lofty, [place] for evaluation of right low back pain, right foot paresthesia, her primary care physician is Dr. Willey Blade, initial evaluation was on September 13, 2022  I reviewed and summarized the referring note. PMHx Right breast Cancer, s/p lobectomy in 2013, chemo-radiation.  Since her breast cancer treatment, she began physically active, fitness training regularly, around 2017, she began to feel right posterior thigh tightness, in 1 session, she tried to alleviate right leg muscle spasm by using foam roller, after few sessions, she described severe radiating pain from right posterior leg to right foot, right leg went numb, also had subjective weakness, she was able to manage care at home, but ever since then, she had intermittent right posterior neck symptoms, radiating paresthesia to right foot, now with persistent right fifth toe numbness, she has tried muscle relaxant, acupuncture, physical therapy with limited help  Denies left leg symptoms, denies upper extremity symptoms  She also complains of urinary urgency, frequency  PHYSICAL EXAM:   Vitals:   09/13/22 0911  BP: 130/82  Pulse: 62  Weight: 172 lb (78  kg)  Height: _0  (1.676 m)   Not recorded     Body mass index is 27.76 kg/m.  PHYSICAL EXAMNIATION:  Gen: NAD, conversant, well nourised, well groomed                     Cardiovascular: Regular rate rhythm, no peripheral edema, warm, nontender. Eyes: Conjunctivae clear without exudates or hemorrhage Neck: Supple, no carotid bruits. Pulmonary: Clear to auscultation bilaterally   NEUROLOGICAL EXAM:  MENTAL STATUS: Speech/cognition: Awake, alert, oriented to history taking and casual conversation CRANIAL NERVES: CN II: Visual fields are full to confrontation. Pupils are round equal and briskly reactive to light. CN III, IV, VI: extraocular movement are normal. No ptosis. CN V: Facial sensation is intact to light touch CN VII: Face is symmetric with normal eye closure  CN VIII: Hearing is normal to causal conversation. CN IX, X: Phonation is normal. CN XI: Head turning and shoulder shrug are intact  MOTOR: Bilateral hammertoe, most noticeable at right second toe, mild right toe flexion weakness  REFLEXES: Reflexes are 1 and symmetric at the biceps, triceps, knees, and trace at ankles. Plantar responses are flexor.  SENSORY: Absent vibratory sensation at toes, length-dependent decreased light touch pinprick to above ankle level  COORDINATION: There is no trunk or limb dysmetria noted.  GAIT/STANCE: Able to get up from seated position arm crossed, mildly antalgic  REVIEW OF SYSTEMS:  Full 14 system review of systems performed and notable only for as above All other review of systems were negative.   ALLERGIES: No Known Allergies  HOME MEDICATIONS: Current Outpatient Medications  Medication Sig  Dispense Refill   B Complex-Biotin-FA (B COMPLETE PO) B Complete  Take 1 capsule daily     ibuprofen (ADVIL) 400 MG tablet Take 400 mg by mouth every 6 (six) hours as needed.     Multiple Vitamins-Minerals (MULTIVITAMIN WITH MINERALS) tablet Take 1 tablet by mouth daily.      SAMBUCOL BLACK ELDERBERRY PO      TURMERIC PO Take by mouth.     VITAMIN D PO Take by mouth.     No current facility-administered medications for this visit.    PAST MEDICAL HISTORY: Past Medical History:  Diagnosis Date   Arthritis    Cancer ([place])    Breast Cancer, negative BRCA testing    PAST SURGICAL HISTORY: Past Surgical History:  Procedure Laterality Date   BREAST LUMPECTOMY     COLOSTOMY  2013   TUBAL LIGATION  09/24/1982    FAMILY HISTORY: Family History  Problem Relation Age of Onset   Lung cancer Father    Breast cancer Sister    Colon cancer Neg Hx    Colon polyps Neg Hx    Rectal cancer Neg Hx    Stomach cancer Neg Hx    Esophageal cancer Neg Hx     SOCIAL HISTORY: Social History   Socioeconomic History   Marital status: Widowed    Spouse name: Not on file   Number of children: Not on file   Years of education: Not on file   Highest education level: Not on file  Occupational History   Not on file  Tobacco Use   Smoking status: Every Day    Packs/day: 0.25    Types: Cigarettes   Smokeless tobacco: Never  Vaping Use   Vaping Use: Never used  Substance and Sexual Activity   Alcohol use: Not Currently    Comment: occ   Drug use: No   Sexual activity: Never    Partners: Male  Other Topics Concern   Not on file  Social History Narrative   Not on file   Social Determinants of Health   Financial Resource Strain: Not on file  Food Insecurity: Not on file  Transportation Needs: Not on file  Physical Activity: Not on file  Stress: Not on file  Social Connections: Not on file  Intimate Partner Violence: Not on file      Marcial Pacas, M.D. Ph.D.  [facility] [address], [place], [place] [address] Ph: [phone] Fax: [phone]  CC:  [address], [address],  [place] [address]  Willey Blade, MD

## 2022-10-01 ENCOUNTER — Telehealth: Payer: Self-pay | Admitting: Neurology

## 2022-10-01 NOTE — Telephone Encounter (Signed)
MRI lumbar spine was denied by [facility] after reviewing the office notes stating that the MRI is not medically necessary because it will not result in a change of treatment leading to surgery or a procedure. There is not a peer to peer option, but you can call for further explanation if you would like. [phone] reference: [id number]

## 2022-10-02 NOTE — Telephone Encounter (Signed)
LVM asking pt to call back to schedule ncv/emg

## 2022-10-02 NOTE — Telephone Encounter (Signed)
Please help me connected for her MRI lumbar request.  I also ordered EMG/NCS, did not see her on my schedule?

## 2023-02-06 ENCOUNTER — Encounter: Payer: Self-pay | Admitting: Internal Medicine

## 2023-02-15 ENCOUNTER — Ambulatory Visit
Admission: RE | Admit: 2023-02-15 | Discharge: 2023-02-15 | Disposition: A | Discharge status: Home / Self Care | Payer: BC Managed Care – PPO | Source: Ambulatory Visit | Attending: Internal Medicine | Admitting: Internal Medicine

## 2023-02-15 DIAGNOSIS — F17200 Nicotine dependence, unspecified, uncomplicated: Secondary | ICD-10-CM

## 2023-03-20 ENCOUNTER — Other Ambulatory Visit: Payer: Self-pay | Admitting: Orthopedic Surgery

## 2023-04-15 ENCOUNTER — Encounter
Admission: RE | Admit: 2023-04-15 | Discharge: 2023-04-15 | Disposition: A | Discharge status: Home / Self Care | Payer: BC Managed Care – PPO | Source: Ambulatory Visit | Attending: Orthopedic Surgery | Admitting: Orthopedic Surgery

## 2023-04-15 ENCOUNTER — Other Ambulatory Visit: Payer: Self-pay

## 2023-04-15 VITALS — BP 132/77 | HR 64 | Resp 14 | Ht 67.0 in | Wt 169.0 lb

## 2023-04-15 DIAGNOSIS — Z01818 Encounter for other preprocedural examination: Secondary | ICD-10-CM | POA: Diagnosis present

## 2023-04-15 DIAGNOSIS — F17200 Nicotine dependence, unspecified, uncomplicated: Secondary | ICD-10-CM | POA: Diagnosis not present

## 2023-04-15 DIAGNOSIS — Z01810 Encounter for preprocedural cardiovascular examination: Secondary | ICD-10-CM | POA: Diagnosis not present

## 2023-04-15 DIAGNOSIS — I498 Other specified cardiac arrhythmias: Secondary | ICD-10-CM | POA: Diagnosis not present

## 2023-04-15 LAB — CBC WITH DIFFERENTIAL/PLATELET
Abs Immature Granulocytes: 0.02 10*3/uL (ref 0.00–0.07)
Basophils Absolute: 0 10*3/uL (ref 0.0–0.1)
Basophils Relative: 1 %
Eosinophils Absolute: 0.1 10*3/uL (ref 0.0–0.5)
Eosinophils Relative: 2 %
HCT: 41.1 % (ref 36.0–46.0)
Hemoglobin: 13.8 g/dL (ref 12.0–15.0)
Immature Granulocytes: 0 %
Lymphocytes Relative: 16 %
Lymphs Abs: 1.2 10*3/uL (ref 0.7–4.0)
MCH: 30.7 pg (ref 26.0–34.0)
MCHC: 33.6 g/dL (ref 30.0–36.0)
MCV: 91.5 fL (ref 80.0–100.0)
Monocytes Absolute: 0.5 10*3/uL (ref 0.1–1.0)
Monocytes Relative: 6 %
Neutro Abs: 5.8 10*3/uL (ref 1.7–7.7)
Neutrophils Relative %: 75 %
Platelets: 274 10*3/uL (ref 150–400)
RBC: 4.49 MIL/uL (ref 3.87–5.11)
RDW: 13.2 % (ref 11.5–15.5)
WBC: 7.7 10*3/uL (ref 4.0–10.5)
nRBC: 0 % (ref 0.0–0.2)

## 2023-04-15 LAB — COMPREHENSIVE METABOLIC PANEL
ALT: 17 U/L (ref 0–44)
AST: 22 U/L (ref 15–41)
Albumin: 3.7 g/dL (ref 3.5–5.0)
Alkaline Phosphatase: 61 U/L (ref 38–126)
Anion gap: 7 (ref 5–15)
BUN: 20 mg/dL (ref 8–23)
CO2: 24 mmol/L (ref 22–32)
Calcium: 8.9 mg/dL (ref 8.9–10.3)
Chloride: 105 mmol/L (ref 98–111)
Creatinine, Ser: 1.04 mg/dL — ABNORMAL HIGH (ref 0.44–1.00)
GFR, Estimated: >60 mL/min (ref >60)
Glucose, Bld: 97 mg/dL (ref 70–99)
Potassium: 3.6 mmol/L (ref 3.5–5.1)
Sodium: 136 mmol/L (ref 135–145)
Total Bilirubin: 0.6 mg/dL (ref 0.3–1.2)
Total Protein: 6.5 g/dL (ref 6.5–8.1)

## 2023-04-15 LAB — URINALYSIS, ROUTINE W REFLEX MICROSCOPIC
Bilirubin Urine: NEGATIVE
Glucose, UA: NEGATIVE mg/dL
Hgb urine dipstick: NEGATIVE
Ketones, ur: NEGATIVE mg/dL
Leukocytes,Ua: NEGATIVE
Nitrite: NEGATIVE
Protein, ur: NEGATIVE mg/dL
Specific Gravity, Urine: 1.009 (ref 1.005–1.030)
pH: 5 (ref 5.0–8.0)

## 2023-04-15 LAB — TYPE AND SCREEN
ABO/RH(D): O POS
Antibody Screen: NEGATIVE

## 2023-04-15 LAB — SURGICAL PCR SCREEN
MRSA, PCR: NEGATIVE
Staphylococcus aureus: NEGATIVE

## 2023-04-15 NOTE — Patient Instructions (Signed)
Your procedure is scheduled on: Monday 04/22/23 To find out your arrival time, please call [phone] between 1PM - 3PM on:   Friday 04/19/23 Report to the Registration Desk on the 1st floor of the [facility]. Free Valet parking is available.  If your arrival time is 6:00 am, do not arrive before that time as the [facility] entrance doors do not open until 6:00 am.  REMEMBER: Instructions that are not followed completely may result in serious medical risk, up to and including death; or upon the discretion of your surgeon and anesthesiologist your surgery may need to be rescheduled.  Do not eat food after midnight the night before surgery.  No gum chewing or hard candies.  You may however, drink CLEAR liquids up to 2 hours before you are scheduled to arrive for your surgery. Do not drink anything within 2 hours of your scheduled arrival time.  Clear liquids include: - water  - apple juice without pulp - gatorade (not RED colors) - black coffee or tea (Do NOT add milk or creamers to the coffee or tea) Do NOT drink anything that is not on this list.  Type 1 and Type 2 diabetics should only drink water.  In addition, your doctor has ordered for you to drink the provided:  Ensure Pre-Surgery Clear Carbohydrate Drink  Drinking this carbohydrate drink up to two hours before surgery helps to reduce insulin resistance and improve patient outcomes. Please complete drinking 2 hours before scheduled arrival time.  One week prior to surgery: Stop Anti-inflammatories (NSAIDS) such as Advil, Aleve, Ibuprofen, Motrin, Naproxen, Naprosyn and Aspirin based products such as Excedrin, Goody's Powder, BC Powder. You may however, continue to take Tylenol if needed for pain up until the day of surgery. (3000 mg in 24 hours)  Stop ANY OVER THE COUNTER supplements and vitamins until after surgery.  Continue taking all prescribed medications.  TAKE ONLY THESE MEDICATIONS THE MORNING OF SURGERY WITH A  SIP OF WATER:  chantix  No Alcohol for 24 hours before or after surgery.  No Smoking including e-cigarettes for 24 hours before surgery.  No chewable tobacco products for at least 6 hours before surgery.  No nicotine patches on the day of surgery.  Do not use any "recreational" drugs for at least a week (preferably 2 weeks) before your surgery.  Please be advised that the combination of cocaine and anesthesia may have negative outcomes, up to and including death. If you test positive for cocaine, your surgery will be cancelled.  On the morning of surgery brush your teeth with toothpaste and water, you may rinse your mouth with mouthwash if you wish. Do not swallow any toothpaste or mouthwash.  Use CHG Soap or wipes as directed on instruction sheet. Shower daily for 5 consecutive days starting Thursday 04/18/23 thru Monday 04/22/23  Do not wear lotions, powders, or perfumes on the day of surgery.  Do not shave body hair from the neck down 48 hours before surgery.  Wear comfortable clothing (specific to your surgery type) to the hospital.  Do not wear jewelry, make-up, hairpins, clips or nail polish.  Contact lenses, hearing aids and dentures may not be worn into surgery.  Do not bring valuables to the hospital. [facility] is not responsible for any missing/lost belongings or valuables.   Notify your doctor if there is any change in your medical condition (cold, fever, infection).  If you are being discharged the day of surgery, you will not be allowed to  drive home. You will need a responsible individual to drive you home and stay with you for 24 hours after surgery.   If you are taking public transportation, you will need to have a responsible individual with you.  If you are being admitted to the hospital overnight, leave your suitcase in the car. After surgery it may be brought to your room.  In case of increased patient census, it may be necessary for you, the patient, to  continue your postoperative care in the Same Day Surgery department.  After surgery, you can help prevent lung complications by doing breathing exercises.  Take deep breaths and cough every 1-2 hours. Your doctor may order a device called an Incentive Spirometer to help you take deep breaths. When coughing or sneezing, hold a pillow firmly against your incision with both hands. This is called "splinting." Doing this helps protect your incision. It also decreases belly discomfort.  Surgery Visitation Policy:  Patients undergoing a surgery or procedure may have two family members or support persons with them as long as the person is not COVID-19 positive or experiencing its symptoms.   Inpatient Visitation:    Visiting hours are 7 a.m. to 8 p.m. Up to four visitors are allowed at one time in a patient room. The visitors may rotate out with other people during the day. One designated support person (adult) may remain overnight.  Please call the Pre-admissions Testing Dept. at [phone] if you have any questions about these instructions.    Pre-operative 5 CHG Bath Instructions   You can play a key role in reducing the risk of infection after surgery. Your skin needs to be as free of germs as possible. You can reduce the number of germs on your skin by washing with CHG (chlorhexidine gluconate) soap before surgery. CHG is an antiseptic soap that kills germs and continues to kill germs even after washing.   DO NOT use if you have an allergy to chlorhexidine/CHG or antibacterial soaps. If your skin becomes reddened or irritated, stop using the CHG and notify one of our RNs at [phone].   Please shower with the CHG soap starting 4 days before surgery using the following schedule:     Please keep in mind the following:  DO NOT shave, including legs and underarms, starting the day of your first shower.   You may shave your face at any point before/day of surgery.  Place clean sheets  on your bed the day you start using CHG soap. Use a clean washcloth (not used since being washed) for each shower. DO NOT sleep with pets once you start using the CHG.   CHG Shower Instructions:  If you choose to wash your hair and private area, wash first with your normal shampoo/soap.  After you use shampoo/soap, rinse your hair and body thoroughly to remove shampoo/soap residue.  Turn the water OFF and apply about 3 tablespoons (45 ml) of CHG soap to a CLEAN washcloth.  Apply CHG soap ONLY FROM YOUR NECK DOWN TO YOUR TOES (washing for 3-5 minutes)  DO NOT use CHG soap on face, private areas, open wounds, or sores.  Pay special attention to the area where your surgery is being performed.  If you are having back surgery, having someone wash your back for you may be helpful. Wait 2 minutes after CHG soap is applied, then you may rinse off the CHG soap.  Pat dry with a clean towel  Put on clean clothes/pajamas  If you choose to wear lotion, please use ONLY the CHG-compatible lotions on the back of this paper.     Additional instructions for the day of surgery: DO NOT APPLY any lotions, deodorants, cologne, or perfumes.   Put on clean/comfortable clothes.  Brush your teeth.  Ask your nurse before applying any prescription medications to the skin.      CHG Compatible Lotions   Aveeno Moisturizing lotion  Cetaphil Moisturizing Cream  Cetaphil Moisturizing Lotion  Clairol Herbal Essence Moisturizing Lotion, Dry Skin  Clairol Herbal Essence Moisturizing Lotion, Extra Dry Skin  Clairol Herbal Essence Moisturizing Lotion, Normal Skin  Curel Age Defying Therapeutic Moisturizing Lotion with Alpha Hydroxy  Curel Extreme Care Body Lotion  Curel Soothing Hands Moisturizing Hand Lotion  Curel Therapeutic Moisturizing Cream, Fragrance-Free  Curel Therapeutic Moisturizing Lotion, Fragrance-Free  Curel Therapeutic Moisturizing Lotion, Original Formula  Eucerin Daily Replenishing Lotion   Eucerin Dry Skin Therapy Plus Alpha Hydroxy Crme  Eucerin Dry Skin Therapy Plus Alpha Hydroxy Lotion  Eucerin Original Crme  Eucerin Original Lotion  Eucerin Plus Crme Eucerin Plus Lotion  Eucerin TriLipid Replenishing Lotion  Keri Anti-Bacterial Hand Lotion  Keri Deep Conditioning Original Lotion Dry Skin Formula Softly Scented  Keri Deep Conditioning Original Lotion, Fragrance Free Sensitive Skin Formula  Keri Lotion Fast Absorbing Fragrance Free Sensitive Skin Formula  Keri Lotion Fast Absorbing Softly Scented Dry Skin Formula  Keri Original Lotion  Keri Skin Renewal Lotion Keri Silky Smooth Lotion  Keri Silky Smooth Sensitive Skin Lotion  Nivea Body Creamy Conditioning Oil  Nivea Body Extra Enriched Lotion  Nivea Body Original Lotion  Nivea Body Sheer Moisturizing Lotion Nivea Crme  Nivea Skin Firming Lotion  NutraDerm 30 Skin Lotion  NutraDerm Skin Lotion  NutraDerm Therapeutic Skin Cream  NutraDerm Therapeutic Skin Lotion  ProShield Protective Hand Cream  Provon moisturizing lotion  How to Use an Incentive Spirometer  An incentive spirometer is a tool that measures how well you are filling your lungs with each breath. Learning to take long, deep breaths using this tool can help you keep your lungs clear and active. This may help to reverse or lessen your chance of developing breathing (pulmonary) problems, especially infection. You may be asked to use a spirometer: After a surgery. If you have a lung problem or a history of smoking. After a long period of time when you have been unable to move or be active. If the spirometer includes an indicator to show the highest number that you have reached, your health care provider or respiratory therapist will help you set a goal. Keep a log of your progress as told by your health care provider. What are the risks? Breathing too quickly may cause dizziness or cause you to pass out. Take your time so you do not get dizzy or  light-headed. If you are in pain, you may need to take pain medicine before doing incentive spirometry. It is harder to take a deep breath if you are having pain. How to use your incentive spirometer  Sit up on the edge of your bed or on a chair. Hold the incentive spirometer so that it is in an upright position. Before you use the spirometer, breathe out normally. Place the mouthpiece in your mouth. Make sure your lips are closed tightly around it. Breathe in slowly and as deeply as you can through your mouth, causing the piston or the ball to rise toward the top of the chamber. Hold your breath for 3-5 seconds, or  for as long as possible. If the spirometer includes a coach indicator, use this to guide you in breathing. Slow down your breathing if the indicator goes above the marked areas. Remove the mouthpiece from your mouth and breathe out normally. The piston or ball will return to the bottom of the chamber. Rest for a few seconds, then repeat the steps 10 or more times. Take your time and take a few normal breaths between deep breaths so that you do not get dizzy or light-headed. Do this every 1-2 hours when you are awake. If the spirometer includes a goal marker to show the highest number you have reached (best effort), use this as a goal to work toward during each repetition. After each set of 10 deep breaths, cough a few times. This will help to make sure that your lungs are clear. If you have an incision on your chest or abdomen from surgery, place a pillow or a rolled-up towel firmly against the incision when you cough. This can help to reduce pain while taking deep breaths and coughing. General tips When you are able to get out of bed: Walk around often. Continue to take deep breaths and cough in order to clear your lungs. Keep using the incentive spirometer until your health care provider says it is okay to stop using it. If you have been in the hospital, you may be told to keep  using the spirometer at home. Contact a health care provider if: You are having difficulty using the spirometer. You have trouble using the spirometer as often as instructed. Your pain medicine is not giving enough relief for you to use the spirometer as told. You have a fever. Get help right away if: You develop shortness of breath. You develop a cough with bloody mucus from the lungs. You have fluid or blood coming from an incision site after you cough. Summary An incentive spirometer is a tool that can help you learn to take long, deep breaths to keep your lungs clear and active. You may be asked to use a spirometer after a surgery, if you have a lung problem or a history of smoking, or if you have been inactive for a long period of time. Use your incentive spirometer as instructed every 1-2 hours while you are awake. If you have an incision on your chest or abdomen, place a pillow or a rolled-up towel firmly against your incision when you cough. This will help to reduce pain. Get help right away if you have shortness of breath, you cough up bloody mucus, or blood comes from your incision when you cough. This information is not intended to replace advice given to you by your health care provider. Make sure you discuss any questions you have with your health care provider. Document Revised: 11/30/2019 Document Reviewed: 11/30/2019 Elsevier Patient Education  2023 Elsevier Inc[employer]    Preoperative Educational Videos for Total Hip, Knee and Shoulder Replacements  To better prepare for surgery, please view our videos that explain the physical activity and discharge planning required to have the best surgical recovery at [facility].  [email or web address]  Questions? Call [phone] or email [email or web address]

## 2023-04-16 LAB — NICOTINE SCREEN, URINE: Cotinine Ql Scrn, Ur: NEGATIVE ng/mL

## 2023-04-22 ENCOUNTER — Ambulatory Visit: Payer: BC Managed Care – PPO

## 2023-04-22 ENCOUNTER — Encounter
Admission: RE | Disposition: A | Discharge status: Home Health | Payer: Self-pay | Source: Home / Self Care | Attending: Orthopedic Surgery

## 2023-04-22 ENCOUNTER — Ambulatory Visit: Payer: BC Managed Care – PPO | Admitting: Registered Nurse

## 2023-04-22 ENCOUNTER — Encounter: Payer: Self-pay | Admitting: Orthopedic Surgery

## 2023-04-22 ENCOUNTER — Other Ambulatory Visit: Payer: Self-pay

## 2023-04-22 ENCOUNTER — Observation Stay
Admission: RE | Admit: 2023-04-22 | Discharge: 2023-04-23 | Disposition: A | Discharge status: Home Health | Payer: BC Managed Care – PPO | Attending: Orthopedic Surgery | Admitting: Orthopedic Surgery

## 2023-04-22 ENCOUNTER — Ambulatory Visit: Payer: BC Managed Care – PPO | Admitting: Urgent Care

## 2023-04-22 DIAGNOSIS — M1712 Unilateral primary osteoarthritis, left knee: Principal | ICD-10-CM | POA: Insufficient documentation

## 2023-04-22 DIAGNOSIS — Z96652 Presence of left artificial knee joint: Principal | ICD-10-CM

## 2023-04-22 DIAGNOSIS — Z853 Personal history of malignant neoplasm of breast: Secondary | ICD-10-CM | POA: Diagnosis not present

## 2023-04-22 HISTORY — PX: TOTAL KNEE ARTHROPLASTY: SHX125

## 2023-04-22 SURGERY — ARTHROPLASTY, KNEE, TOTAL
Anesthesia: Spinal | Site: Knee | Laterality: Left

## 2023-04-22 MED ORDER — METOCLOPRAMIDE HCL 5 MG/ML IJ SOLN: 5.0000 mg | Freq: Three times a day (TID) | INTRAMUSCULAR | Status: DC | PRN

## 2023-04-22 MED ORDER — TRANEXAMIC ACID-NACL 1000-0.7 MG/100ML-% IV SOLN
INTRAVENOUS | Status: AC
  Filled 2023-04-22: qty 100

## 2023-04-22 MED ORDER — TRAMADOL HCL 50 MG PO TABS
50.0000 mg | ORAL_TABLET | Freq: Four times a day (QID) | ORAL | Status: DC | PRN
  Administered 2023-04-22 (×2): 50 mg via ORAL

## 2023-04-22 MED ORDER — LACTATED RINGERS IV SOLN: INTRAVENOUS | Status: DC | PRN

## 2023-04-22 MED ORDER — PANTOPRAZOLE SODIUM 40 MG PO TBEC
40.0000 mg | DELAYED_RELEASE_TABLET | Freq: Every day | ORAL | Status: DC
  Administered 2023-04-22 – 2023-04-23 (×2): 40 mg via ORAL

## 2023-04-22 MED ORDER — TRANEXAMIC ACID 1000 MG/10ML IV SOLN
1000.0000 mg | Freq: Once | INTRAVENOUS | Status: DC
Start: 2023-04-22 — End: 2023-04-22

## 2023-04-22 MED ORDER — KETOROLAC TROMETHAMINE 15 MG/ML IJ SOLN
INTRAMUSCULAR | Status: AC
  Filled 2023-04-22: qty 1

## 2023-04-22 MED ORDER — EPHEDRINE SULFATE (PRESSORS) 50 MG/ML IJ SOLN
INTRAMUSCULAR | Status: DC | PRN
  Administered 2023-04-22: 10 mg via INTRAVENOUS
  Administered 2023-04-22 (×2): 5 mg via INTRAVENOUS

## 2023-04-22 MED ORDER — ONDANSETRON HCL 4 MG/2ML IJ SOLN: 4.0000 mg | Freq: Four times a day (QID) | INTRAMUSCULAR | Status: DC | PRN

## 2023-04-22 MED ORDER — BUPIVACAINE-EPINEPHRINE (PF) 0.5% -1:200000 IJ SOLN
INTRAMUSCULAR | Status: AC
  Filled 2023-04-22: qty 30

## 2023-04-22 MED ORDER — PANTOPRAZOLE SODIUM 40 MG PO TBEC
DELAYED_RELEASE_TABLET | ORAL | Status: AC
  Filled 2023-04-22: qty 1

## 2023-04-22 MED ORDER — OXYCODONE HCL 5 MG PO TABS: 5.0000 mg | ORAL_TABLET | Freq: Once | ORAL | Status: DC | PRN

## 2023-04-22 MED ORDER — PHENOL 1.4 % MT LIQD: 1.0000 | OROMUCOSAL | Status: DC | PRN

## 2023-04-22 MED ORDER — CEFAZOLIN SODIUM-DEXTROSE 2-4 GM/100ML-% IV SOLN
INTRAVENOUS | Status: AC
  Filled 2023-04-22: qty 100

## 2023-04-22 MED ORDER — GLYCOPYRROLATE 0.2 MG/ML IJ SOLN
INTRAMUSCULAR | Status: AC
  Filled 2023-04-22: qty 2

## 2023-04-22 MED ORDER — TRANEXAMIC ACID-NACL 1000-0.7 MG/100ML-% IV SOLN: 1000.0000 mg | INTRAVENOUS | Status: AC

## 2023-04-22 MED ORDER — KETOROLAC TROMETHAMINE 15 MG/ML IJ SOLN
7.5000 mg | Freq: Four times a day (QID) | INTRAMUSCULAR | Status: AC
  Administered 2023-04-22 – 2023-04-23 (×4): 7.5 mg via INTRAVENOUS

## 2023-04-22 MED ORDER — TRAMADOL HCL 50 MG PO TABS
ORAL_TABLET | ORAL | Status: AC
  Filled 2023-04-22: qty 1

## 2023-04-22 MED ORDER — DOCUSATE SODIUM 100 MG PO CAPS
ORAL_CAPSULE | ORAL | Status: AC
  Filled 2023-04-22: qty 1

## 2023-04-22 MED ORDER — ACETAMINOPHEN 10 MG/ML IV SOLN: 1000.0000 mg | Freq: Once | INTRAVENOUS | Status: DC | PRN

## 2023-04-22 MED ORDER — DEXAMETHASONE SODIUM PHOSPHATE 10 MG/ML IJ SOLN
8.0000 mg | Freq: Once | INTRAMUSCULAR | Status: AC
  Administered 2023-04-22: 8 mg via INTRAVENOUS

## 2023-04-22 MED ORDER — ONDANSETRON HCL 4 MG/2ML IJ SOLN: 4.0000 mg | Freq: Once | INTRAMUSCULAR | Status: DC | PRN

## 2023-04-22 MED ORDER — METOCLOPRAMIDE HCL 10 MG PO TABS: 5.0000 mg | ORAL_TABLET | Freq: Three times a day (TID) | ORAL | Status: DC | PRN

## 2023-04-22 MED ORDER — FENTANYL CITRATE (PF) 100 MCG/2ML IJ SOLN
INTRAMUSCULAR | Status: AC
  Filled 2023-04-22: qty 2

## 2023-04-22 MED ORDER — HYDROCODONE-ACETAMINOPHEN 5-325 MG PO TABS
1.0000 | ORAL_TABLET | ORAL | Status: DC | PRN
  Administered 2023-04-22: 1 via ORAL

## 2023-04-22 MED ORDER — SODIUM CHLORIDE FLUSH 0.9 % IV SOLN
INTRAVENOUS | Status: AC
  Filled 2023-04-22: qty 20

## 2023-04-22 MED ORDER — MELATONIN 5 MG PO TABS
2.5000 mg | ORAL_TABLET | Freq: Every day | ORAL | Status: DC
  Administered 2023-04-22: 2.5 mg via ORAL

## 2023-04-22 MED ORDER — ACETAMINOPHEN 10 MG/ML IV SOLN
INTRAVENOUS | Status: AC
  Filled 2023-04-22: qty 100

## 2023-04-22 MED ORDER — DEXAMETHASONE SODIUM PHOSPHATE 10 MG/ML IJ SOLN
INTRAMUSCULAR | Status: AC
  Filled 2023-04-22: qty 1

## 2023-04-22 MED ORDER — ACETAMINOPHEN 500 MG PO TABS
ORAL_TABLET | ORAL | Status: AC
  Filled 2023-04-22: qty 2

## 2023-04-22 MED ORDER — MENTHOL 3 MG MT LOZG: 1.0000 | LOZENGE | OROMUCOSAL | Status: DC | PRN

## 2023-04-22 MED ORDER — BUPIVACAINE LIPOSOME 1.3 % IJ SUSP
INTRAMUSCULAR | Status: DC | PRN
  Administered 2023-04-22: 20 mL

## 2023-04-22 MED ORDER — GLYCOPYRROLATE 0.2 MG/ML IJ SOLN
INTRAMUSCULAR | Status: DC | PRN
  Administered 2023-04-22 (×2): .2 mg via INTRAVENOUS

## 2023-04-22 MED ORDER — BUPIVACAINE HCL (PF) 0.5 % IJ SOLN
INTRAMUSCULAR | Status: DC | PRN
  Administered 2023-04-22: 2.4 mL

## 2023-04-22 MED ORDER — ENOXAPARIN SODIUM 30 MG/0.3ML IJ SOSY
30.0000 mg | PREFILLED_SYRINGE | Freq: Two times a day (BID) | INTRAMUSCULAR | Status: DC
  Administered 2023-04-23: 30 mg via SUBCUTANEOUS

## 2023-04-22 MED ORDER — MIDAZOLAM HCL 5 MG/5ML IJ SOLN
INTRAMUSCULAR | Status: DC | PRN
  Administered 2023-04-22: 2 mg via INTRAVENOUS

## 2023-04-22 MED ORDER — MIDAZOLAM HCL 2 MG/2ML IJ SOLN
INTRAMUSCULAR | Status: AC
  Filled 2023-04-22: qty 2

## 2023-04-22 MED ORDER — SODIUM CHLORIDE 0.9 % IV SOLN: INTRAVENOUS | Status: DC

## 2023-04-22 MED ORDER — PROPOFOL 10 MG/ML IV BOLUS
INTRAVENOUS | Status: AC
  Filled 2023-04-22: qty 20

## 2023-04-22 MED ORDER — FENTANYL CITRATE (PF) 100 MCG/2ML IJ SOLN: 25.0000 ug | INTRAMUSCULAR | Status: DC | PRN

## 2023-04-22 MED ORDER — EPHEDRINE 5 MG/ML INJ
INTRAVENOUS | Status: AC
  Filled 2023-04-22: qty 5

## 2023-04-22 MED ORDER — BUPIVACAINE LIPOSOME 1.3 % IJ SUSP
INTRAMUSCULAR | Status: AC
  Filled 2023-04-22: qty 20

## 2023-04-22 MED ORDER — FENTANYL CITRATE (PF) 100 MCG/2ML IJ SOLN
INTRAMUSCULAR | Status: DC | PRN
  Administered 2023-04-22 (×4): 25 ug via INTRAVENOUS

## 2023-04-22 MED ORDER — CEFAZOLIN SODIUM-DEXTROSE 2-4 GM/100ML-% IV SOLN
2.0000 g | INTRAVENOUS | Status: AC
  Administered 2023-04-22: 2 g via INTRAVENOUS

## 2023-04-22 MED ORDER — PROPOFOL 1000 MG/100ML IV EMUL
INTRAVENOUS | Status: AC
  Filled 2023-04-22: qty 100

## 2023-04-22 MED ORDER — VARENICLINE TARTRATE 0.5 MG PO TABS
1.0000 mg | ORAL_TABLET | Freq: Two times a day (BID) | ORAL | Status: DC
  Administered 2023-04-22 – 2023-04-23 (×2): 1 mg via ORAL
  Filled 2023-04-22 (×2): qty 2

## 2023-04-22 MED ORDER — CHLORHEXIDINE GLUCONATE 0.12 % MT SOLN
OROMUCOSAL | Status: AC
  Filled 2023-04-22: qty 15

## 2023-04-22 MED ORDER — BUPIVACAINE-EPINEPHRINE (PF) 0.5% -1:200000 IJ SOLN
INTRAMUSCULAR | Status: DC | PRN
  Administered 2023-04-22: 30 mL via PERINEURAL

## 2023-04-22 MED ORDER — TRANEXAMIC ACID-NACL 1000-0.7 MG/100ML-% IV SOLN
1000.0000 mg | INTRAVENOUS | Status: AC
  Administered 2023-04-22: 1000 mg via INTRAVENOUS

## 2023-04-22 MED ORDER — HYDROCODONE-ACETAMINOPHEN 5-325 MG PO TABS
ORAL_TABLET | ORAL | Status: AC
  Filled 2023-04-22: qty 1

## 2023-04-22 MED ORDER — ACETAMINOPHEN 10 MG/ML IV SOLN
INTRAVENOUS | Status: DC | PRN
  Administered 2023-04-22: 1000 mg via INTRAVENOUS

## 2023-04-22 MED ORDER — ACETAMINOPHEN 500 MG PO TABS
1000.0000 mg | ORAL_TABLET | Freq: Three times a day (TID) | ORAL | Status: DC
  Administered 2023-04-22 – 2023-04-23 (×3): 1000 mg via ORAL

## 2023-04-22 MED ORDER — OXYCODONE HCL 5 MG/5ML PO SOLN: 5.0000 mg | Freq: Once | ORAL | Status: DC | PRN

## 2023-04-22 MED ORDER — MORPHINE SULFATE (PF) 4 MG/ML IV SOLN: 0.5000 mg | INTRAVENOUS | Status: DC | PRN

## 2023-04-22 MED ORDER — SODIUM CHLORIDE (PF) 0.9 % IJ SOLN
INTRAMUSCULAR | Status: DC | PRN
  Administered 2023-04-22: 20 mL

## 2023-04-22 MED ORDER — ONDANSETRON HCL 4 MG PO TABS: 4.0000 mg | ORAL_TABLET | Freq: Four times a day (QID) | ORAL | Status: DC | PRN

## 2023-04-22 MED ORDER — SODIUM CHLORIDE 0.9 % IR SOLN
Status: DC | PRN
  Administered 2023-04-22: 3000 mL

## 2023-04-22 MED ORDER — TRANEXAMIC ACID-NACL 1000-0.7 MG/100ML-% IV SOLN
INTRAVENOUS | Status: DC | PRN
  Administered 2023-04-22: 1000 mg via INTRAVENOUS

## 2023-04-22 MED ORDER — ONDANSETRON HCL 4 MG/2ML IJ SOLN
INTRAMUSCULAR | Status: AC
  Filled 2023-04-22: qty 2

## 2023-04-22 MED ORDER — ONDANSETRON HCL 4 MG/2ML IJ SOLN
INTRAMUSCULAR | Status: DC | PRN
  Administered 2023-04-22: 4 mg via INTRAVENOUS

## 2023-04-22 MED ORDER — CEFAZOLIN SODIUM-DEXTROSE 2-4 GM/100ML-% IV SOLN
2.0000 g | Freq: Four times a day (QID) | INTRAVENOUS | Status: AC
  Administered 2023-04-22 (×2): 2 g via INTRAVENOUS

## 2023-04-22 MED ORDER — DOCUSATE SODIUM 100 MG PO CAPS
100.0000 mg | ORAL_CAPSULE | Freq: Two times a day (BID) | ORAL | Status: DC
  Administered 2023-04-22 – 2023-04-23 (×3): 100 mg via ORAL

## 2023-04-22 MED ORDER — SURGIRINSE WOUND IRRIGATION SYSTEM - OPTIME: TOPICAL | Status: DC | PRN

## 2023-04-22 MED ORDER — MELATONIN 5 MG PO TABS
ORAL_TABLET | ORAL | Status: AC
  Filled 2023-04-22: qty 1

## 2023-04-22 MED ORDER — PROPOFOL 500 MG/50ML IV EMUL
INTRAVENOUS | Status: DC | PRN
  Administered 2023-04-22: 75 ug/kg/min via INTRAVENOUS

## 2023-04-22 SURGICAL SUPPLY — 80 items
ADH SKN CLS APL DERMABOND .7 (GAUZE/BANDAGES/DRESSINGS) ×1
APL PRP STRL LF DISP 70% ISPRP (MISCELLANEOUS) ×2
BLADE PATELLA REAM PILOT HOLE (MISCELLANEOUS) IMPLANT
BLADE SAGITTAL AGGR TOOTH XLG (BLADE) IMPLANT
BLADE SAW SAG 25X90X1.19 (BLADE) ×1 IMPLANT
BLADE SAW SAG 29X58X.64 (BLADE) ×1 IMPLANT
BOWL CEMENT MIX W/ADAPTER (MISCELLANEOUS) ×1 IMPLANT
BSPLAT TIB 5D E CMNT STM LT (Knees) ×1 IMPLANT
CEMENT BONE R 1X40 (Cement) ×2 IMPLANT
CHLORAPREP W/TINT 26 (MISCELLANEOUS) ×2 IMPLANT
COOLER POLAR GLACIER W/PUMP (MISCELLANEOUS) ×1 IMPLANT
CUFF TOURN SGL QUICK 24 (TOURNIQUET CUFF)
CUFF TOURN SGL QUICK 30 (TOURNIQUET CUFF)
CUFF TRNQT CYL 24X4X16.5-23 (TOURNIQUET CUFF) IMPLANT
CUFF TRNQT CYL 30X4X21-28X (TOURNIQUET CUFF) IMPLANT
DERMABOND ADVANCED .7 DNX12 (GAUZE/BANDAGES/DRESSINGS) ×1 IMPLANT
DRAPE 3/4 80X56 (DRAPES) ×1 IMPLANT
DRAPE INCISE IOBAN 66X60 STRL (DRAPES) IMPLANT
DRSG MEPILEX SACRM 8.7X9.8 (GAUZE/BANDAGES/DRESSINGS) ×1 IMPLANT
DRSG OPSITE POSTOP 3X4 (GAUZE/BANDAGES/DRESSINGS) IMPLANT
DRSG OPSITE POSTOP 4X10 (GAUZE/BANDAGES/DRESSINGS) IMPLANT
DRSG OPSITE POSTOP 4X8 (GAUZE/BANDAGES/DRESSINGS) IMPLANT
ELECT REM PT RETURN 9FT ADLT (ELECTROSURGICAL) ×1
ELECTRODE REM PT RTRN 9FT ADLT (ELECTROSURGICAL) ×1 IMPLANT
FEMUR CMT CR STD SZ 6 LT KNEE (Joint) ×1 IMPLANT
FEMUR CMTD CR STD SZ 6 LT KNEE (Joint) IMPLANT
GLOVE BIO SURGEON STRL SZ8 (GLOVE) ×1 IMPLANT
GLOVE BIOGEL PI IND STRL 8 (GLOVE) ×1 IMPLANT
GLOVE PI ORTHO PRO STRL 7.5 (GLOVE) ×2 IMPLANT
GLOVE PI ORTHO PRO STRL SZ8 (GLOVE) ×2 IMPLANT
GOWN SRG XL LVL 3 NONREINFORCE (GOWNS) ×1 IMPLANT
GOWN STRL NON-REIN TWL XL LVL3 (GOWNS) ×1
GOWN STRL REUS W/ TWL LRG LVL3 (GOWN DISPOSABLE) ×1 IMPLANT
GOWN STRL REUS W/ TWL XL LVL3 (GOWN DISPOSABLE) ×1 IMPLANT
GOWN STRL REUS W/TWL LRG LVL3 (GOWN DISPOSABLE) ×1
GOWN STRL REUS W/TWL XL LVL3 (GOWN DISPOSABLE) ×1
HANDLE YANKAUER SUCT OPEN TIP (MISCELLANEOUS) ×1 IMPLANT
HOOD PEEL AWAY T7 (MISCELLANEOUS) ×2 IMPLANT
INSERT TIB ARTSURF SZ 6-7 16 (Joint) IMPLANT
IV NS IRRIG 3000ML ARTHROMATIC (IV SOLUTION) ×1 IMPLANT
KIT TURNOVER KIT A (KITS) ×1 IMPLANT
MANIFOLD NEPTUNE II (INSTRUMENTS) ×1 IMPLANT
MARKER SKIN DUAL TIP RULER LAB (MISCELLANEOUS) ×1 IMPLANT
MAT ABSORB FLUID 56X50 GRAY (MISCELLANEOUS) ×1 IMPLANT
NDL FILTER BLUNT 18X1 1/2 (NEEDLE) ×1 IMPLANT
NDL HYPO 21X1.5 SAFETY (NEEDLE) ×1 IMPLANT
NDL SAFETY ECLIP 18X1.5 (MISCELLANEOUS) ×1 IMPLANT
NDL SPNL 20GX3.5 QUINCKE YW (NEEDLE) IMPLANT
NEEDLE FILTER BLUNT 18X1 1/2 (NEEDLE) ×1 IMPLANT
NEEDLE HYPO 21X1.5 SAFETY (NEEDLE) ×1 IMPLANT
NEEDLE SPNL 20GX3.5 QUINCKE YW (NEEDLE) ×1 IMPLANT
PACK TOTAL KNEE (MISCELLANEOUS) ×1 IMPLANT
PAD ARMBOARD 7.5X6 YLW CONV (MISCELLANEOUS) ×3 IMPLANT
PAD WRAPON POLAR KNEE (MISCELLANEOUS) ×1 IMPLANT
PENCIL SMOKE EVACUATOR (MISCELLANEOUS) ×1 IMPLANT
PIN DRILL HDLS TROCAR 75 4PK (PIN) IMPLANT
PULSAVAC PLUS IRRIG FAN TIP (DISPOSABLE) ×1
SCREW FEMALE HEX FIX 25X2.5 (ORTHOPEDIC DISPOSABLE SUPPLIES) IMPLANT
SCREW HEX HEADED 3.5X27 DISP (ORTHOPEDIC DISPOSABLE SUPPLIES) IMPLANT
SLEEVE SCD COMPRESS KNEE MED (STOCKING) ×1 IMPLANT
SOLUTION IRRIG SURGIPHOR (IV SOLUTION) ×1 IMPLANT
STEM POLY PAT PLY 32M KNEE (Knees) IMPLANT
STEM TIB ST PERS 14+30 (Stem) IMPLANT
STEM TIBIA 5 DEG SZ E L KNEE (Knees) IMPLANT
SUT DVC 2 QUILL PDO T11 36X36 (SUTURE) ×1 IMPLANT
SUT QUILL MONODERM 3-0 PS-2 (SUTURE) ×1 IMPLANT
SUT VIC AB 0 CT1 36 (SUTURE) ×1 IMPLANT
SUT VIC AB 2-0 CT2 27 (SUTURE) ×2 IMPLANT
SUT VICRYL 1-0 27IN ABS (SUTURE) ×1
SUTURE VICRYL 1-0 27IN ABS (SUTURE) ×1 IMPLANT
SYR 30ML LL (SYRINGE) ×2 IMPLANT
SYR TB 1ML LL NO SAFETY (SYRINGE) ×1 IMPLANT
TAPE CLOTH 3X10 WHT NS LF (GAUZE/BANDAGES/DRESSINGS) ×1 IMPLANT
TIBIA STEM 5 DEG SZ E L KNEE (Knees) ×1 IMPLANT
TIP FAN IRRIG PULSAVAC PLUS (DISPOSABLE) ×1 IMPLANT
TOWEL OR 17X26 4PK STRL BLUE (TOWEL DISPOSABLE) IMPLANT
TRAP FLUID SMOKE EVACUATOR (MISCELLANEOUS) ×1 IMPLANT
WATER STERILE IRR 1000ML POUR (IV SOLUTION) ×1 IMPLANT
WRAPON POLAR PAD KNEE (MISCELLANEOUS) ×1
patella reamer blade IMPLANT

## 2023-04-22 NOTE — Discharge Instructions (Signed)

## 2023-04-22 NOTE — Evaluation (Addendum)
Physical Therapy Evaluation Patient Details Name: JENESSA SIELER MRN: [id number] DOB: July 11, 1961 Today's Date: 04/22/2023  History of Present Illness  Pt presents s/p L TKA. PMH includes OA and breast cancer.  Clinical Impression  Pt presents laying in bed, current pain 2/10. She currently lives home alone in a 1 story house with 3-4 stairs to enter and no rail. PTA was independent with all mobility/ADLs and enjoys doing crossfit in her spare time.   Gross sensation screening in supine was overall WNL, aside from diminished light touch in medial L knee region. Pt able to perform supine> sit modi, requiring increased time. Pt performed sit<>stand with CGA and RW for safety. In standing, pt noted numbness and tingling in LLE resulted in knee buckling when weight shifting to the left and was able to recover with minA from therapist. Pt would benefit from continued therapy to maximize functional abilities and address changes from PLOF.       If plan is discharge home, recommend the following: A little help with walking and/or transfers;A little help with bathing/dressing/bathroom;Assistance with cooking/housework;Assist for transportation;Help with stairs or ramp for entrance;Direct supervision/assist for medications management   Can travel by private vehicle        Equipment Recommendations Rolling walker (2 wheels);BSC/3in1  Recommendations for Other Services       Functional Status Assessment Patient has had a recent decline in their functional status and demonstrates the ability to make significant improvements in function in a reasonable and predictable amount of time.     Precautions / Restrictions Precautions Precautions: Knee Precaution Booklet Issued: Yes (comment) Restrictions Weight Bearing Restrictions: Yes LLE Weight Bearing: Weight bearing as tolerated      Mobility  Bed Mobility Overal bed mobility: Modified Independent             General bed mobility comments:  increased time, HOB elevated    Transfers Overall transfer level: Needs assistance Equipment used: Rolling walker (2 wheels) Transfers: Sit to/from Stand Sit to Stand: Min assist           General transfer comment: numbness/tingling in RLE upon standing, began buckling with weight shifting to R and able to recover with therapist assist    Ambulation/Gait               General Gait Details: Unable to walk due to diminished sensation/numbness/tingling with standing  Stairs            Wheelchair Mobility     Tilt Bed    Modified Rankin (Stroke Patients Only)       Balance                                             Pertinent Vitals/Pain Pain Assessment Pain Assessment: 0-10 Pain Score: 2  Pain Location: L knee Pain Descriptors / Indicators: Dull, Discomfort Pain Intervention(s): Monitored during session    Home Living Family/patient expects to be discharged to:: Private residence Living Arrangements: Alone Available Help at Discharge: Other (Comment) (has help at d/c, did not specify who) Type of Home: House Home Access: Stairs to enter Entrance Stairs-Rails: None Entrance Stairs-Number of Steps: 3-4   Home Layout: One level Home Equipment: None      Prior Function Prior Level of Function : Independent/Modified Independent             Mobility Comments: Ambulated without  AD, does crossfit in free time       Hand Dominance        Extremity/Trunk Assessment   Upper Extremity Assessment Upper Extremity Assessment: Overall WFL for tasks assessed    Lower Extremity Assessment Lower Extremity Assessment: LLE deficits/detail LLE Sensation: decreased light touch (decreased sensation in medial L knee. Numbness/tingling upon standing, unable to take step without buckling)       Communication   Communication: No difficulties  Cognition Arousal/Alertness: Awake/alert Behavior During Therapy: WFL for tasks  assessed/performed Overall Cognitive Status: Within Functional Limits for tasks assessed                                          General Comments      Exercises Total Joint Exercises Ankle Circles/Pumps: AROM, Both, 20 reps, Supine Quad Sets: AROM, Left, 10 reps, Supine Goniometric ROM: Knee flexion: ~80*   Assessment/Plan    PT Assessment Patient needs continued PT services  PT Problem List Decreased strength;Decreased range of motion;Decreased activity tolerance;Decreased balance;Decreased mobility;Decreased safety awareness;Decreased knowledge of use of DME;Decreased knowledge of precautions;Impaired sensation       PT Treatment Interventions DME instruction;Therapeutic exercise;Gait training;Balance training;Stair training;Functional mobility training;Therapeutic activities;Patient/family education;Neuromuscular re-education    PT Goals (Current goals can be found in the Care Plan section)  Acute Rehab PT Goals Patient Stated Goal: return home PT Goal Formulation: With patient Time For Goal Achievement: 05/06/23 Potential to Achieve Goals: Good    Frequency Min 1X/week     Co-evaluation               AM-PAC PT "6 Clicks" Mobility  Outcome Measure Help needed turning from your back to your side while in a flat bed without using bedrails?: None Help needed moving from lying on your back to sitting on the side of a flat bed without using bedrails?: None Help needed moving to and from a bed to a chair (including a wheelchair)?: A Little Help needed standing up from a chair using your arms (e.g., wheelchair or bedside chair)?: A Little Help needed to walk in hospital room?: A Little Help needed climbing 3-5 steps with a railing? : A Little 6 Click Score: 20    End of Session Equipment Utilized During Treatment: Gait belt Activity Tolerance: Patient tolerated treatment well Patient left: in bed;with call bell/phone within reach;with SCD's  reapplied   PT Visit Diagnosis: Difficulty in walking, not elsewhere classified (R26.2);Muscle weakness (generalized) (M62.81)    Time: 6045-4098 PT Time Calculation (min) (ACUTE ONLY): 14 min   Charges:   PT Evaluation $PT Eval Low Complexity: 1 Low PT Treatments $Therapeutic Exercise: 8-22 mins PT General Charges $$ ACUTE PT VISIT: 1 Visit        Itzae Mccurdy, PT, SPT 3:32 PM,04/22/23
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
## 2023-04-22 NOTE — Op Note (Signed)
Patient Name: Jaime Grimes  OZH:[id number]  Pre-Operative Diagnosis: Left knee Osteoarthritis  Post-Operative Diagnosis: (same)  Procedure: Left Total Knee Arthroplasty  Components/Implants: Femur: Persona Size 6 CR   Tibia: Persona Size E w/ 14x54mm stem extension  Poly: 16mm MC  Patella: 32x8.57mm symmetric  Femoral Valgus Cut Angle: 5 degrees  Distal Femoral Re-cut: none  Patella Resurfacing: yes   Date of Surgery: 04/22/2023  Surgeon: Reinaldo Berber MD  Assistant: Amador Cunas PA (present and scrubbed throughout the case, critical for assistance with exposure, retraction, instrumentation, and closure)   Anesthesiologist: Suzan Slick  Anesthesia: Spinal   Tourniquet Time: 77 min  EBL: 75cc  IVF: 1000cc  Complications: None   Brief history: The patient is a 62 year old female with a history of osteoarthritis of the left knee with pain limiting their range of motion and activities of daily living, which has failed multiple attempts at conservative therapy.  The risks and benefits of total knee arthroplasty as definitive surgical treatment were discussed with the patient, who opted to proceed with the operation.  After outpatient medical clearance and optimization was completed the patient was admitted to [facility] for the procedure.  All preoperative films were reviewed and an appropriate surgical plan was made prior to surgery. Preoperative range of motion was 0 to 95. The patient was identified as having a varus alignment.   Description of procedure: The patient was brought to the operating room where laterality was confirmed by all those present to be the left side.   Spinal anesthesia was administered and the patient received an intravenous dose of antibiotics for surgical prophylaxis and a dose of tranexamic acid.  Patient is positioned supine on the operating room table with all bony prominences well-padded.  A well-padded tourniquet was applied to the  left thigh.  The knee was then prepped and draped in usual sterile fashion with multiple layers of adhesive and nonadhesive drapes.  All of those present in the operating room participated in a surgical timeout laterality and patient were confirmed.   An Esmarch was wrapped around the extremity and the leg was elevated and the knee flexed.  The tourniquet was inflated to a pressure of 275 mmHg. The Esmarch was removed and the leg was brought down to full extension.  The patella and tibial tubercle identified and outlined using a marking pen and a midline skin incision was made with a knife carried through the subcutaneous tissue down to the extensor retinaculum.  After exposure of the extensor mechanism the medial parapatellar arthrotomy was performed with a scalpel and electrocautery extending down medial and distal to the tibial tubercle taking care to avoid incising the patellar tendon.   A standard medial release was performed over the proximal tibia.  The knee was brought into extension in order to excise the fat pad taking care not to damage the patella tendon.  The superior soft tissue was removed from the anterior surface of the distal femur to visualize for the procedure.  The knee was then brought into flexion with the patella subluxed laterally and subluxing the tibia anteriorly.  The ACL was transected and removed with electrocautery and additional soft tissue was removed from the proximal surface of the tibia to fully expose. The PCL was found to be intact and was preserved. There was significant bone loss off of the posteromedial aspect of the tibial plateau noted.   An extramedullary tibial cutting guide was then applied to the leg with a spring-loaded ankle clamp placed  around the distal tibia just above the malleoli the angulation of the guide was adjusted to give some posterior slope in the tibial resection with an appropriate varus/valgus alignment.  The resection guide was then pinned to the  proximal tibia and the proximal tibial surface was resected with an oscillating saw.  Careful attention was paid to ensure the blade did not disrupt any of the soft tissues including any lateral or medial ligament.  Attention was then turned to the femur, with the knee slightly flexed a opening drill was used to enter the medullary canal of the femur.  After removing the drill marrow was suctioned out to decompress the distal femur.  An intramedullary femoral guide was then inserted into the drill hole and the alignment guide was seated firmly against the distal end of the medial femoral condyle.  The distal femoral cutting guide was then attached and pinned securely to the anterior surface of the femur and the intramedullary rod and alignment guide was removed.  Distal femur resection was then performed with an oscillating saw with retractors protecting medial and laterally.   The distal cutting block was then removed and the extension gap was checked with a spacer.  Extension gap was found to be appropriately sized to accommodate the spacer block.   The femoral sizing guide was then placed securely into the posterior condyles of the femur and the femoral size was measured and determined to be 6.  The size 6; 4-in-1 cutting guide was placed in position and secured with 2 pins.  The anterior posterior and chamfer resections were then performed with an oscillating saw.  Bony fragments and osteophytes were then removed.  Using a lamina spreader the posterior medial and lateral condyles were checked for additional osteophytes and posterior soft tissue remnants.  Any remaining meniscus was removed at this time.  Periarticular injection was performed in the meniscal rims and posterior capsule with aspiration performed to ensure no intravascular injection.   The tibia was then exposed and the tibial trial was pinned onto the plateau after confirming appropriate orientation and rotation.  Using the drill bushing  the tibia was prepared to the appropriate drill depth.  Tibial broach impactor was then driven through the punch guide using a mallet.  The femoral trial component was then inserted onto the femur. A trial tibial polyethylene bearing was then placed and the knee was reduced.  The knee achieved full extension with no hyperextension and was found to be balanced in flexion and extension with the trials in place.  The knee was then brought into full extension the patella was everted and held with 2 Kocher clamps.  The articular surface of the patella was then resected with an patella reamer and saw after careful measurement with a caliper.  The patella was then prepared with the drill guide and a trial patella was placed.  The knee was then taken through range of motion and it was found that the patella articulated appropriately with the trochlea and good patellofemoral motion without subluxation.    The correct final components for implantation were confirmed and opened by the circulator nurse.  The prepared surfaces of the patella femur and tibia were cleaned with pulsatile lavage to remove all blood fat and other material and then the surfaces were dried.  2 bags of cement were mixed under vacuum and the components were cemented into place.  Excess cement was removed with curettes and forceps. A trial polyethylene tibial component was placed and the  knee was brought into extension to allow the cement to set.  At this time the periarticular injection cocktail was placed in the soft tissues surrounding the knee.  After full curing of the cement the balance of the knee was checked again and the final polyethylene size was confirmed. The tibial component was irrigated and locking mechanism checked to ensure it was clear of debris. The real polyethylene tibial component was implanted and the knee was brought through a range of motion.   The knee was then irrigated with copious amount of normal saline via pulsatile  lavage to remove all loose bodies and other debris.  The knee was then irrigated with surgiphor betadine based wash and reirrigated with saline.  The tourniquet was then dropped and all bleeding vessels were identified and coagulated.  The arthrotomy was approximated with #1 Vicryl and closed with #2 Quill suture.  The knee was brought into slight flexion and the subcutaneous tissues were closed with 0 Vicryl, 2-0 Vicryl and a running subcuticular 3-0 monoderm quil suture.  Skin was then glued with Dermabond.  A sterile adhesive dressing was then placed along with a sequential compression device to the calf, a Ted stocking, and a cryotherapy cuff.   Sponge, needle, and Lap counts were all correct at the end of the case.   The patient was transferred off of the operating room table to a hospital bed, good pulses were found distally on the operative side.  The patient was transferred to the recovery room in stable condition.

## 2023-04-22 NOTE — Anesthesia Procedure Notes (Signed)
Procedure Name: MAC Date/Time: 04/22/2023 7:30 AM  Performed by: Lily Lovings, CRNAPre-anesthesia Checklist: Patient identified, Emergency Drugs available, Suction available, Patient being monitored and Timeout performed Patient Re-evaluated:Patient Re-evaluated prior to induction Oxygen Delivery Method: Simple face mask Preoxygenation: Pre-oxygenation with 100% oxygen Induction Type: IV induction

## 2023-04-22 NOTE — Transfer of Care (Signed)
Immediate Anesthesia Transfer of Care Note  Patient: Jaime Grimes  Procedure(s) Performed: TOTAL KNEE ARTHROPLASTY (Left: Knee)  Patient Location: PACU  Anesthesia Type:MAC and Spinal  Level of Consciousness: awake and patient cooperative  Airway & Oxygen Therapy: Patient Spontanous Breathing  Post-op Assessment: Report given to RN, Post -op Vital signs reviewed and stable, and Patient able to stick tongue midline  Post vital signs: Reviewed and stable  Last Vitals:  Vitals Value Taken Time  BP 129/53 04/22/23 1011  Temp    Pulse 65 04/22/23 1014  Resp 18 04/22/23 1014  SpO2 97 % 04/22/23 1014  Vitals shown include unfiled device data.  Last Pain:  Vitals:   04/22/23 0612  TempSrc: Temporal  PainSc: 0-No pain      Patients Stated Pain Goal: 0 (04/22/23 0612)  Complications: No notable events documented.

## 2023-04-22 NOTE — Anesthesia Procedure Notes (Signed)
Spinal  Patient location during procedure: OR Start time: 04/22/2023 7:35 AM End time: 04/22/2023 7:40 AM Reason for block: surgical anesthesia Staffing Performed: anesthesiologist and resident/CRNA  Anesthesiologist: Corinda Gubler, MD Resident/CRNA: Lily Lovings, CRNA Performed by: Lily Lovings, CRNA Authorized by: Corinda Gubler, MD   Preanesthetic Checklist Completed: patient identified, IV checked, site marked, risks and benefits discussed, surgical consent, monitors and equipment checked and pre-op evaluation Spinal Block Patient position: sitting Prep: Betadine and ChloraPrep Patient monitoring: heart rate, cardiac monitor, continuous pulse ox and blood pressure Approach: midline Location: L4-5 Injection technique: single-shot Needle Needle type: Pencan  Needle gauge: 24 G Needle length: 9 cm Assessment Events: CSF return Additional Notes Negative paresthesia. Negative blood return. Positive free-flowing CSF. Expiration date of kit checked and confirmed. Patient tolerated procedure well, without complications.

## 2023-04-22 NOTE — Discharge Summary (Signed)
Physician Discharge Summary  Patient ID: Jaime Grimes MRN: [id number] DOB/AGE: 62-Aug-1962 62 y.o.  Admit date: 04/22/2023 Discharge date:04/23/2023  Admission Diagnoses:  S/P TKR (total knee replacement) using cement, left [Z96.652]   Discharge Diagnoses: Patient Active Problem List   Diagnosis Date Noted   S/P TKR (total knee replacement) using cement, left 04/22/2023   Right leg pain 09/13/2022   Paresthesia 09/13/2022   Hammer toe of right foot 09/13/2022   Smoker 08/25/2022   Idiopathic peripheral neuropathy 12/19/2021   Vitamin D deficiency 12/19/2021   History of radiation therapy 07/12/2020   Encounter for monitoring aromatase inhibitor therapy 12/21/2014   Obesity (BMI 30-39.9) 03/03/2014   Breast cancer, right breast (HCC) 01/06/2014    Past Medical History:  Diagnosis Date   Arthritis    Cancer (HCC)    Breast Cancer, negative BRCA testing     Transfusion: none   Consultants (if any):   Discharged Condition: Improved  Hospital Course: Jaime Grimes is an 62 y.o. female who was admitted 04/22/2023 with a diagnosis of S/P TKR (total knee replacement) using cement, left and went to the operating room on 04/22/2023 and underwent the above named procedures.    Surgeries: Procedure(s): TOTAL KNEE ARTHROPLASTY on 04/22/2023 Patient tolerated the surgery well. Taken to PACU where she was stabilized and then transferred to the orthopedic floor.  Started on Lovenox 30 mg q 12 hrs. TEDs and SCDs applied bilaterally. Heels elevated on bed. No evidence of DVT. Negative Homan. Physical therapy started on day #1 for gait training and transfer. OT started day #1 for ADL and assisted devices.  Patient's IV was d/c on day #1. Patient was able to safely and independently complete all PT goals. PT recommending discharge to home.    On post op day #1 patient was stable and ready for discharge to home with HHPT.  Implants: Femur: Persona Size 6 CR   Tibia: Persona Size E w/  14x29mm stem extension  Poly: 16mm MC  Patella: 32x8.47mm symmetric    She was given perioperative antibiotics:  Anti-infectives (From admission, onward)    Start     Dose/Rate Route Frequency Ordered Stop   04/22/23 1330  ceFAZolin (ANCEF) IVPB 2g/100 mL premix        2 g 200 mL/hr over 30 Minutes Intravenous Every 6 hours 04/22/23 1218 04/23/23 0129   04/22/23 0630  ceFAZolin (ANCEF) IVPB 2g/100 mL premix        2 g 200 mL/hr over 30 Minutes Intravenous On call to O.R. 04/22/23 5284 04/22/23 0818     .  She was given sequential compression devices, early ambulation, and Lovenox TEDs for DVT prophylaxis.  She benefited maximally from the hospital stay and there were no complications.    Recent vital signs:  Vitals:   04/22/23 1215 04/22/23 1548  BP: 131/80 138/73  Pulse: 88 62  Resp: 20 18  Temp: 97.8 F (36.6 C) 98.7 F (37.1 C)  SpO2: 100% 99%    Recent laboratory studies:  Lab Results  Component Value Date   HGB 13.8 04/15/2023   HGB 14.7 09/12/2017   Lab Results  Component Value Date   WBC 7.7 04/15/2023   PLT 274 04/15/2023   No results found for: "INR" Lab Results  Component Value Date   NA 136 04/15/2023   K 3.6 04/15/2023   CL 105 04/15/2023   CO2 24 04/15/2023   BUN 20 04/15/2023   CREATININE 1.04 (H) 04/15/2023   GLUCOSE  97 04/15/2023    Discharge Medications:   Allergies as of 04/22/2023   No Known Allergies   Med Rec must be completed prior to using this [facility]***        Durable Medical Equipment  (From admission, onward)           Start     Ordered   04/22/23 1620  For home use only DME Walker rolling  Once       Question Answer Comment  Walker: With 5 Inch Wheels   Patient needs a walker to treat with the following condition Impaired mobility      04/22/23 1619            Diagnostic Studies: DG Knee Left Port  Result Date: 04/22/2023 CLINICAL DATA:  S/P total knee replacement EXAM: PORTABLE LEFT KNEE - 1-2 VIEW  COMPARISON:  None available FINDINGS: Left total knee prosthesis is well seated without periprosthetic fracture or lucency. Subcutaneous emphysema consistent with immediate postop status. IMPRESSION: Uncomplicated left total knee prosthesis with immediate postop changes. Electronically Signed   By: Acquanetta Belling M.D.   On: 04/22/2023 11:33    Disposition:      Follow-up Information     Evon Slack, PA-C Follow up in 2 week(s).   Specialties: Orthopedic Surgery, Emergency Medicine Contact information: [address] [place] [address] [phone]                  Signed: Patience Musca 04/22/2023, 6:45 PM

## 2023-04-22 NOTE — TOC Progression Note (Signed)
Transition of Care [facility]) - Progression Note    Patient Details  Name: Jaime Grimes MRN: [id number] Date of Birth: 07/21/1961  Transition of Care [facility]) CM/SW Contact  Marlowe Sax, RN Phone Number: 04/22/2023, 4:19 PM  Clinical Narrative:     Set up with [facility] for [facility] services prior to Surgery by surgeons office RW to be delivered by [employer] to the bedside       Expected Discharge Plan and Services                                               Social Determinants of Health (SDOH) Interventions SDOH Screenings   Food Insecurity: No Food Insecurity (04/22/2023)  Housing: Low Risk  (04/22/2023)  Transportation Needs: No Transportation Needs (04/22/2023)  Utilities: Not At Risk (04/22/2023)  Social Connections: Unknown (02/06/2022)   Received from [facility]  Tobacco Use: Medium Risk (04/22/2023)    Readmission Risk Interventions     No data to display

## 2023-04-22 NOTE — Anesthesia Preprocedure Evaluation (Signed)
Anesthesia Evaluation  Patient identified by MRN, date of birth, ID band Patient awake    Reviewed: Allergy & Precautions, NPO status , Patient's Chart, lab work & pertinent test results  History of Anesthesia Complications Negative for: history of anesthetic complications  Airway Mallampati: II  TM Distance: >3 FB Neck ROM: Full    Dental no notable dental hx. (+) Teeth Intact   Pulmonary neg pulmonary ROS, neg sleep apnea, neg COPD, Patient abstained from smoking.Not current smoker, former smoker   Pulmonary exam normal breath sounds clear to auscultation       Cardiovascular Exercise Tolerance: Good METS(-) hypertension(-) CAD and (-) Past MI negative cardio ROS (-) dysrhythmias  Rhythm:Regular Rate:Normal - Systolic murmurs    Neuro/Psych negative neurological ROS  negative psych ROS   GI/Hepatic ,neg GERD  ,,(+)     (-) substance abuse    Endo/Other  neg diabetes    Renal/GU negative Renal ROS     Musculoskeletal  (+) Arthritis ,    Abdominal   Peds  Hematology   Anesthesia Other Findings Past Medical History: No date: Arthritis No date: Cancer (HCC)     Comment:  Breast Cancer, negative BRCA testing  Reproductive/Obstetrics                             Anesthesia Physical Anesthesia Plan  ASA: 2  Anesthesia Plan: Spinal   Post-op Pain Management: Ofirmev IV (intra-op)*   Induction: Intravenous  PONV Risk Score and Plan: 2 and Ondansetron, Dexamethasone, Propofol infusion, TIVA and Midazolam  Airway Management Planned: Natural Airway  Additional Equipment: None  Intra-op Plan:   Post-operative Plan:   Informed Consent: I have reviewed the patients History and Physical, chart, labs and discussed the procedure including the risks, benefits and alternatives for the proposed anesthesia with the patient or authorized representative who has indicated his/her understanding  and acceptance.       Plan Discussed with: CRNA and Surgeon  Anesthesia Plan Comments: (Discussed R/B/A of neuraxial anesthesia technique with patient: - rare risks of spinal/epidural hematoma, nerve damage, infection - Risk of PDPH - Risk of nausea and vomiting - Risk of conversion to general anesthesia and its associated risks, including sore throat, damage to lips/eyes/teeth/oropharynx, and rare risks such as cardiac and respiratory events. - Risk of allergic reactions  Discussed the role of CRNA in patient's perioperative care.  Patient voiced understanding.)       Anesthesia Quick Evaluation

## 2023-04-22 NOTE — Interval H&P Note (Signed)
Patient history and physical updated. Consent reviewed including risks, benefits, and alternatives to surgery. Patient agrees with above plan to proceed with left total knee arthroplasty.

## 2023-04-23 ENCOUNTER — Encounter: Payer: Self-pay | Admitting: Orthopedic Surgery

## 2023-04-23 DIAGNOSIS — M1712 Unilateral primary osteoarthritis, left knee: Secondary | ICD-10-CM | POA: Diagnosis not present

## 2023-04-23 MED ORDER — ENOXAPARIN SODIUM 30 MG/0.3ML IJ SOSY
PREFILLED_SYRINGE | INTRAMUSCULAR | Status: AC
  Filled 2023-04-23: qty 0.3

## 2023-04-23 MED ORDER — TRAMADOL HCL 50 MG PO TABS: 50.0000 mg | ORAL_TABLET | Freq: Four times a day (QID) | ORAL | 0 refills | Status: AC | PRN

## 2023-04-23 MED ORDER — PANTOPRAZOLE SODIUM 40 MG PO TBEC
DELAYED_RELEASE_TABLET | ORAL | Status: AC
  Filled 2023-04-23: qty 1

## 2023-04-23 MED ORDER — ACETAMINOPHEN 500 MG PO TABS: 1000.0000 mg | ORAL_TABLET | Freq: Three times a day (TID) | ORAL | 0 refills | Status: AC

## 2023-04-23 MED ORDER — CELECOXIB 100 MG PO CAPS
100.0000 mg | ORAL_CAPSULE | Freq: Two times a day (BID) | ORAL | 0 refills | Status: AC
Start: 2023-04-23 — End: 2023-05-03

## 2023-04-23 MED ORDER — ENOXAPARIN SODIUM 40 MG/0.4ML IJ SOSY: 40.0000 mg | PREFILLED_SYRINGE | INTRAMUSCULAR | 0 refills | Status: AC

## 2023-04-23 MED ORDER — DOCUSATE SODIUM 100 MG PO CAPS
ORAL_CAPSULE | ORAL | Status: AC
  Filled 2023-04-23: qty 1

## 2023-04-23 MED ORDER — DOCUSATE SODIUM 100 MG PO CAPS: 100.0000 mg | ORAL_CAPSULE | Freq: Two times a day (BID) | ORAL | 0 refills | Status: AC

## 2023-04-23 MED ORDER — ONDANSETRON HCL 4 MG PO TABS: 4.0000 mg | ORAL_TABLET | Freq: Four times a day (QID) | ORAL | 0 refills | Status: AC | PRN

## 2023-04-23 MED ORDER — KETOROLAC TROMETHAMINE 15 MG/ML IJ SOLN
INTRAMUSCULAR | Status: AC
  Filled 2023-04-23: qty 1

## 2023-04-23 MED ORDER — ACETAMINOPHEN 500 MG PO TABS
ORAL_TABLET | ORAL | Status: AC
  Filled 2023-04-23: qty 2

## 2023-04-23 NOTE — Plan of Care (Signed)
  Problem: Education: Goal: Knowledge of the prescribed therapeutic regimen will improve Outcome: Progressing   Problem: Activity: Goal: Range of joint motion will improve Outcome: Progressing   Problem: Pain Management: Goal: Pain level will decrease with appropriate interventions Outcome: Progressing   

## 2023-04-23 NOTE — Progress Notes (Signed)
Physical Therapy Treatment Patient Details Name: Jaime Grimes MRN: [id number] DOB: 09/09/1961 Today's Date: 04/23/2023   History of Present Illness Pt presents s/p L TKA. PMH includes OA and breast cancer.    PT Comments  OOB, stairs, HEP and questions answered.  Pt does well with no LOB, buckling or concerns.  She does state she plans to return home alone.  Encouraged +1 supervision initially or staying with niece for a few days.  Pt with no further questions or concerns.  She is show stairs with and without walker.   If plan is discharge home, recommend the following: A little help with walking and/or transfers;A little help with bathing/dressing/bathroom;Assistance with cooking/housework;Assist for transportation;Help with stairs or ramp for entrance;Direct supervision/assist for medications management   Can travel by private vehicle        Equipment Recommendations  Rolling walker (2 wheels);BSC/3in1    Recommendations for Other Services       Precautions / Restrictions Precautions Precautions: Knee Precaution Booklet Issued: Yes (comment) Restrictions Weight Bearing Restrictions: Yes LLE Weight Bearing: Weight bearing as tolerated     Mobility  Bed Mobility Overal bed mobility: Independent               Patient Response: Cooperative  Transfers Overall transfer level: Needs assistance Equipment used: Rolling walker (2 wheels) Transfers: Sit to/from Stand Sit to Stand: Supervision                Ambulation/Gait Ambulation/Gait assistance: Supervision Gait Distance (Feet): 200 Feet Assistive device: Rolling walker (2 wheels) Gait Pattern/deviations: Step-to pattern, Step-through pattern Gait velocity: dec         Stairs Stairs: Yes Stairs assistance: Supervision, Min assist Stair Management: Two rails, Step to pattern, Backwards, Forwards, With walker Number of Stairs: 8 General stair comments: with rails and backwards with  walker   Wheelchair Mobility     Tilt Bed Tilt Bed Patient Response: Cooperative  Modified Rankin (Stroke Patients Only)       Balance Overall balance assessment: Needs assistance Sitting-balance support: Feet supported Sitting balance-Leahy Scale: Normal     Standing balance support: Bilateral upper extremity supported, During functional activity, Reliant on assistive device for balance Standing balance-Leahy Scale: Good                              Cognition Arousal/Alertness: Awake/alert Behavior During Therapy: WFL for tasks assessed/performed Overall Cognitive Status: Within Functional Limits for tasks assessed                                          Exercises      General Comments        Pertinent Vitals/Pain Pain Assessment Pain Assessment: Faces Faces Pain Scale: Hurts little more Pain Location: L knee Pain Descriptors / Indicators: Dull, Discomfort Pain Intervention(s): Limited activity within patient's tolerance, Monitored during session, Repositioned, Premedicated before session    Home Living                          Prior Function            PT Goals (current goals can now be found in the care plan section) Progress towards PT goals: Progressing toward goals    Frequency    Min 1X/week  PT Plan      Co-evaluation              AM-PAC PT "6 Clicks" Mobility   Outcome Measure  Help needed turning from your back to your side while in a flat bed without using bedrails?: None Help needed moving from lying on your back to sitting on the side of a flat bed without using bedrails?: None Help needed moving to and from a bed to a chair (including a wheelchair)?: A Little Help needed standing up from a chair using your arms (e.g., wheelchair or bedside chair)?: A Little Help needed to walk in hospital room?: A Little Help needed climbing 3-5 steps with a railing? : A Little 6 Click Score:  20    End of Session Equipment Utilized During Treatment: Gait belt Activity Tolerance: Patient tolerated treatment well Patient left: in bed;with call bell/phone within reach;with SCD's reapplied Nurse Communication: Mobility status PT Visit Diagnosis: Difficulty in walking, not elsewhere classified (R26.2);Muscle weakness (generalized) (M62.81)     Time: 0865-7846 PT Time Calculation (min) (ACUTE ONLY): 32 min  Charges:    $Gait Training: 8-22 mins $Therapeutic Exercise: 8-22 mins PT General Charges $$ ACUTE PT VISIT: 1 Visit                   Danielle Dess, PTA 04/23/23, 10:47 AM

## 2023-04-23 NOTE — Progress Notes (Addendum)
   Subjective: 1 Day Post-Op Procedure(s) (LRB): TOTAL KNEE ARTHROPLASTY (Left) Patient reports pain as mild.   Patient is well, and has had no acute complaints or problems Denies any CP, SOB, ABD pain. We will continue therapy today.  Plan is to go Home after hospital stay.  Objective: Vital signs in last 24 hours: Temp:  [97.3 F (36.3 C)-98.7 F (37.1 C)] 97.7 F (36.5 C) (07/30 0746) Pulse Rate:  [48-88] 63 (07/30 0746) Resp:  [11-22] 16 (07/30 0746) BP: (109-152)/(52-80) 110/63 (07/30 0746) SpO2:  [98 %-100 %] 99 % (07/30 0746)  Intake/Output from previous day: 07/29 0701 - 07/30 0700 In: 3058.3 [P.O.:240; I.V.:2518.3; IV Piggyback:300] Out: 1625 [Urine:1525; Blood:100] Intake/Output this shift: No intake/output data recorded.  Recent Labs    04/23/23 0625  HGB 12.3   Recent Labs    04/23/23 0625  WBC 10.3  RBC 4.00  HCT 37.4  PLT 230   Recent Labs    04/23/23 0625  NA 136  K 4.0  CL 105  CO2 22  BUN 19  CREATININE 0.64  GLUCOSE 109*  CALCIUM 8.8*   No results for input(s): "LABPT", "INR" in the last 72 hours.  EXAM General - Patient is Alert, Appropriate, and Oriented Extremity - Neurovascular intact Sensation intact distally Intact pulses distally Dorsiflexion/Plantar flexion intact No cellulitis present Compartment soft Dressing - dressing C/D/I and no drainage Motor Function - intact, moving foot and toes well on exam.   Past Medical History:  Diagnosis Date   Arthritis    Cancer (HCC)    Breast Cancer, negative BRCA testing    Assessment/Plan:   1 Day Post-Op Procedure(s) (LRB): TOTAL KNEE ARTHROPLASTY (Left) Principal Problem:   S/P TKR (total knee replacement) using cement, left  Estimated body mass index is 26.47 kg/m as calculated from the following:   Height as of this encounter: 5\' 7"  (1.702 m).   Weight as of this encounter: 76.7 kg. Advance diet Up with therapy Pain controlled Labs and VSS CM to assist with  discharge to home with HHPT today pending safe completion of PT goals  DVT Prophylaxis - Lovenox, TED hose, and SCDs Weight-Bearing as tolerated to left leg   T. Cranston Neighbor, PA-C [facility] 04/23/2023, 8:23 AM Patient seen and examined, agree with above plan.  The patient is doing well status post left total knee arthroplasty, no concerns at this time.  Pain is controlled.  Discussed DVT prophylaxis, pain medication use, and safe transition to home.  All questions answered the patient agrees with above plan will go home after clears PT.   Reinaldo Berber MD

## 2023-04-23 NOTE — Progress Notes (Signed)
DISCHARGE NOTE:  Pt given discharge instructions and verbalized understanding. TED hose on both legs. Walker sent with pt. Pt wheeled to car by staff, family providing transportation.

## 2023-04-23 NOTE — Plan of Care (Signed)
  Problem: Pain Management: Goal: Pain level will decrease with appropriate interventions Outcome: Progressing   Problem: Skin Integrity: Goal: Will show signs of wound healing Outcome: Progressing   

## 2023-04-24 ENCOUNTER — Encounter: Payer: Self-pay | Admitting: Orthopedic Surgery

## 2023-04-24 NOTE — Anesthesia Postprocedure Evaluation (Signed)
Anesthesia Post Note  Patient: Jaime Grimes  Procedure(s) Performed: TOTAL KNEE ARTHROPLASTY (Left: Knee)  Patient location during evaluation: PACU Anesthesia Type: Spinal Level of consciousness: oriented and awake and alert Pain management: pain level controlled Vital Signs Assessment: post-procedure vital signs reviewed and stable Respiratory status: spontaneous breathing, respiratory function stable and patient connected to nasal cannula oxygen Cardiovascular status: blood pressure returned to baseline and stable Postop Assessment: no headache, no backache and no apparent nausea or vomiting Anesthetic complications: no   No notable events documented.   Last Vitals:  Vitals:   04/23/23 0517 04/23/23 0746  BP: 109/68 110/63  Pulse: 60 63  Resp: 16 16  Temp: 36.9 C 36.5 C  SpO2: 98% 99%    Last Pain:  Vitals:   04/23/23 0846  TempSrc:   PainSc: 2                  Corinda Gubler

## 2023-05-10 ENCOUNTER — Encounter: Payer: Self-pay | Admitting: Orthopedic Surgery

## 2023-10-29 IMAGING — MG MM DIGITAL SCREENING BILAT W/ TOMO AND CAD
8 series · 8 of 24 positions shown · non-contrast
Comparison: Previous exam(s).

CLINICAL DATA: Screening.

EXAM:
DIGITAL SCREENING BILATERAL MAMMOGRAM WITH TOMOSYNTHESIS AND CAD
TECHNIQUE: Bilateral screening digital craniocaudal and mediolateral oblique
mammograms were obtained. Bilateral screening digital breast
tomosynthesis was performed. The images were evaluated with
computer-aided detection.

[L CC synth-2D]
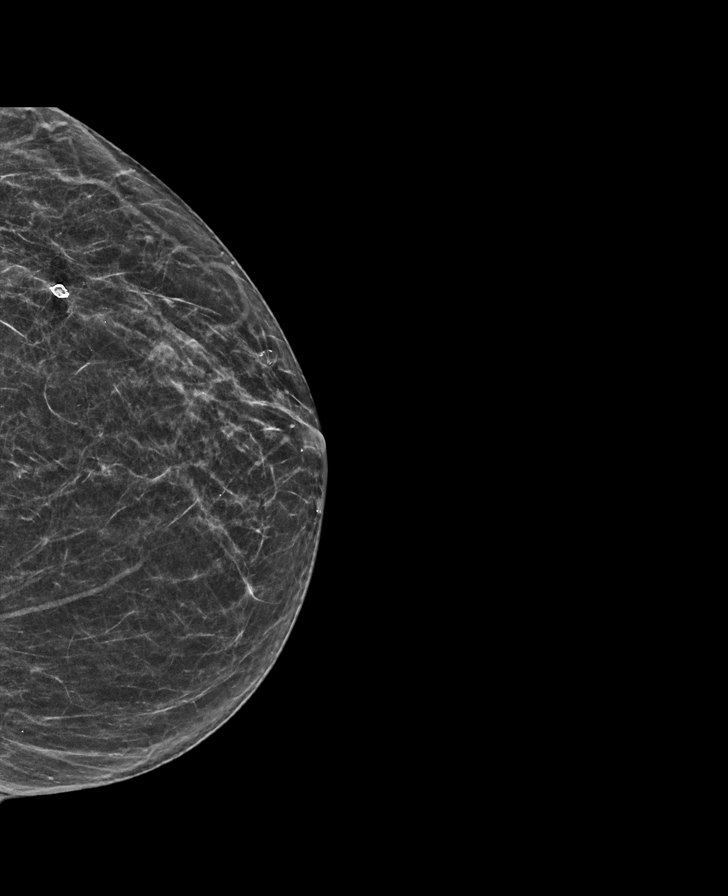

[R MLO synth-2D]
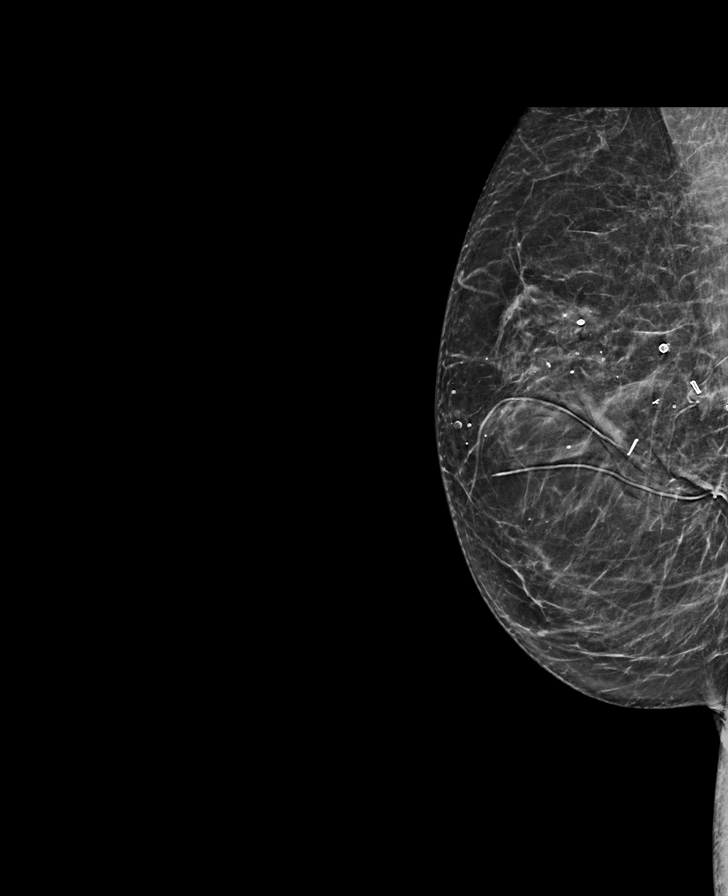

[R CC synth-2D]
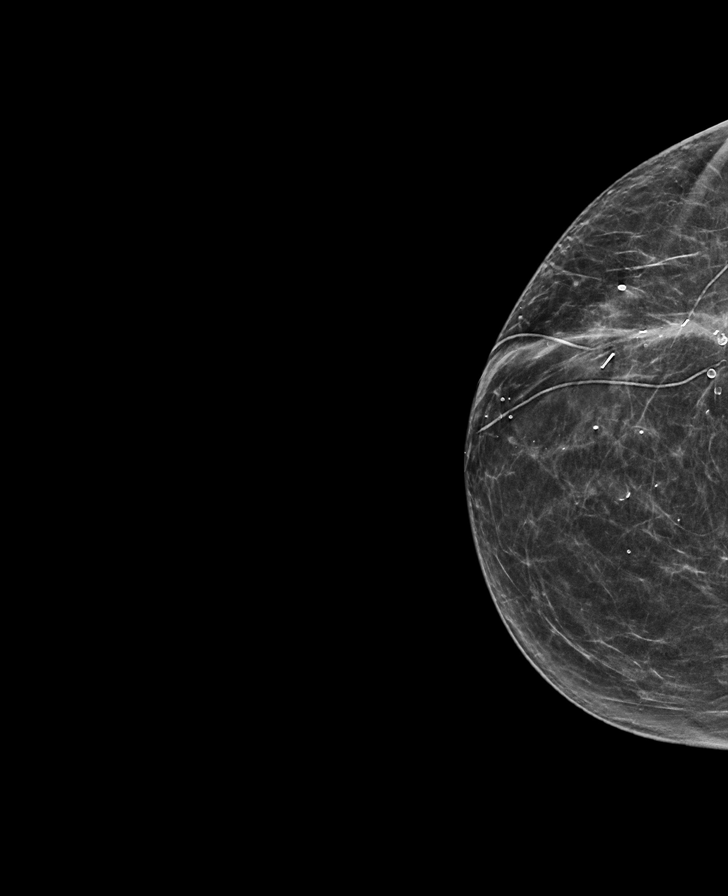

[L MLO synth-2D]
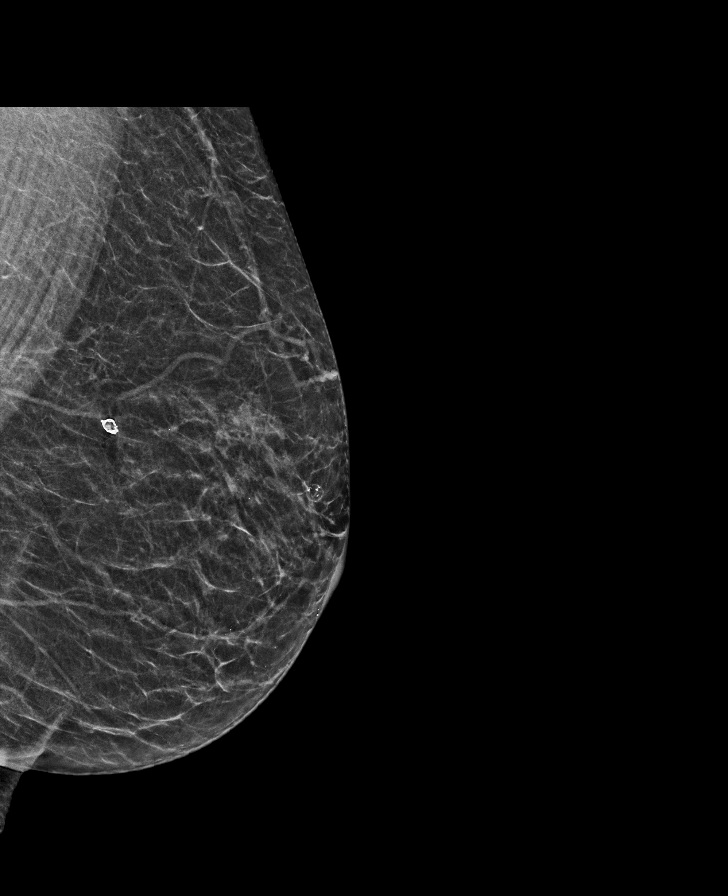

[R CC tomo · tomo slice 33/64.0]
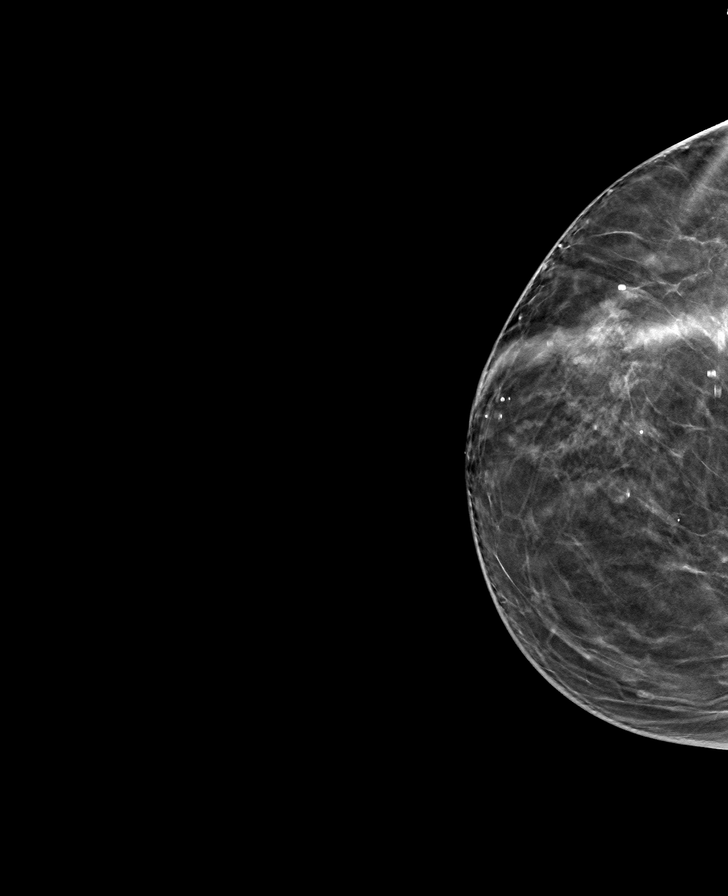

[L CC tomo · tomo slice 28/55.0]
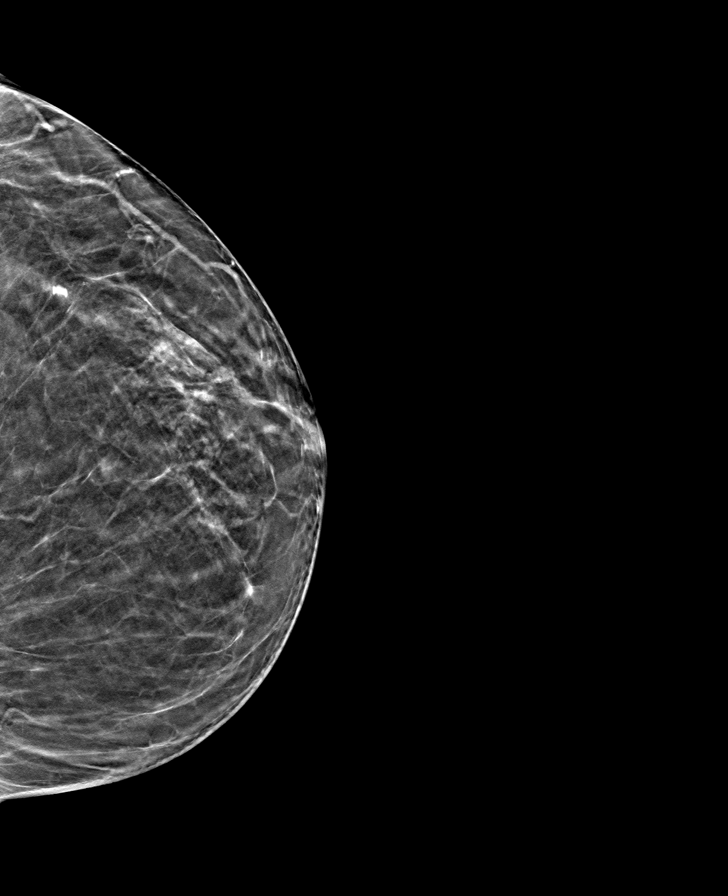

[L MLO tomo · tomo slice 29/56.0]
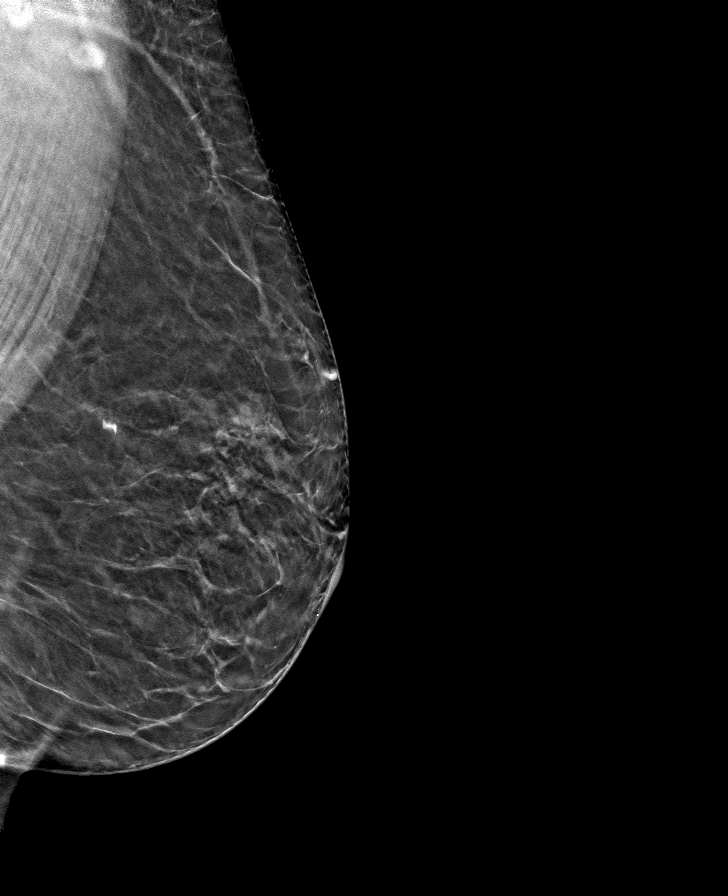

[R MLO tomo · tomo slice 32/63.0]
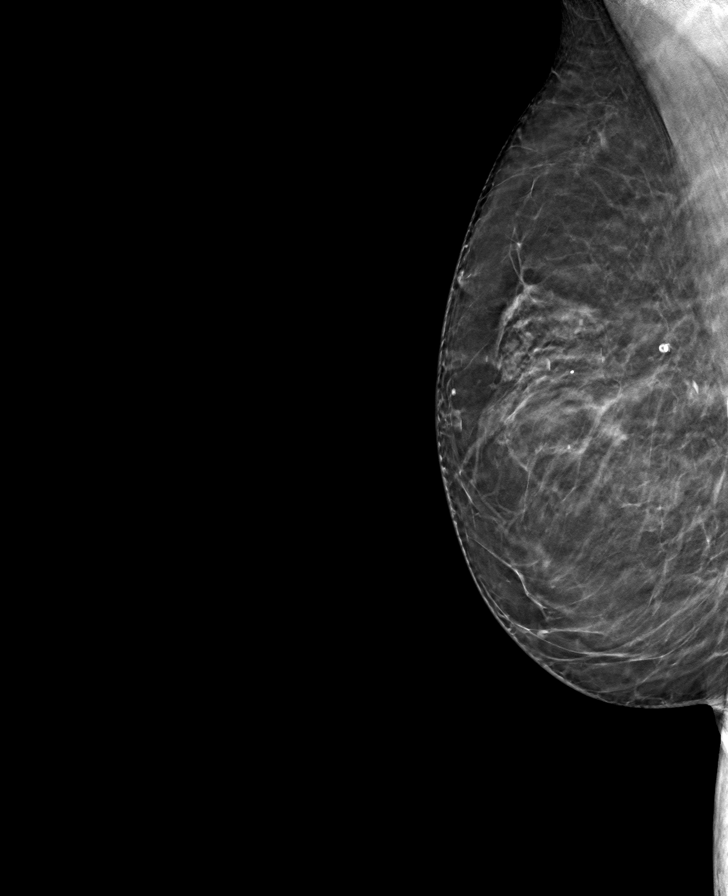

[8 of 24 positions shown; findings below may reference images not displayed]

ACR Breast Density Category b: There are scattered areas of
fibroglandular density.
FINDINGS: There are no findings suspicious for malignancy.
IMPRESSION: No mammographic evidence of malignancy. A result letter of this
screening mammogram will be mailed directly to the patient.

RECOMMENDATION:
Screening mammogram in one year. (Code:51-O-LD2)

BI-RADS CATEGORY  1: Negative.

## 2024-07-08 ENCOUNTER — Other Ambulatory Visit: Payer: Self-pay | Admitting: Internal Medicine

## 2024-07-08 DIAGNOSIS — Z1231 Encounter for screening mammogram for malignant neoplasm of breast: Secondary | ICD-10-CM

## 2024-07-22 ENCOUNTER — Ambulatory Visit
Admission: RE | Admit: 2024-07-22 | Discharge: 2024-07-22 | Disposition: A | Source: Ambulatory Visit | Attending: Internal Medicine | Admitting: Internal Medicine

## 2024-07-22 DIAGNOSIS — Z1231 Encounter for screening mammogram for malignant neoplasm of breast: Secondary | ICD-10-CM

## 2024-07-24 ENCOUNTER — Other Ambulatory Visit: Payer: Self-pay | Admitting: Medical Genetics

## 2024-08-03 ENCOUNTER — Other Ambulatory Visit (HOSPITAL_COMMUNITY)
